# Patient Record
Sex: Female | Born: 1975 | Marital: Single | Smoking: Never smoker
Health system: Southern US, Community
[De-identification: ages and names within clinical notes are randomized; demographics above are authoritative.]

## PROBLEM LIST (undated history)

## (undated) DIAGNOSIS — T7840XA Allergy, unspecified, initial encounter: Secondary | ICD-10-CM

## (undated) DIAGNOSIS — C801 Malignant (primary) neoplasm, unspecified: Secondary | ICD-10-CM

## (undated) DIAGNOSIS — N926 Irregular menstruation, unspecified: Secondary | ICD-10-CM

## (undated) HISTORY — DX: Irregular menstruation, unspecified: N92.6

## (undated) HISTORY — PX: CERVICAL BIOPSY  W/ LOOP ELECTRODE EXCISION: SUR135

## (undated) HISTORY — DX: Malignant (primary) neoplasm, unspecified: C80.1

## (undated) HISTORY — DX: Allergy, unspecified, initial encounter: T78.40XA

---

## 2006-09-30 HISTORY — PX: LEEP: SHX91

## 2008-09-30 HISTORY — PX: LEEP: SHX91

## 2015-02-16 DIAGNOSIS — H524 Presbyopia: Secondary | ICD-10-CM | POA: Insufficient documentation

## 2020-05-12 ENCOUNTER — Encounter: Payer: Self-pay | Admitting: Certified Nurse Midwife

## 2020-05-12 ENCOUNTER — Ambulatory Visit (INDEPENDENT_AMBULATORY_CARE_PROVIDER_SITE_OTHER): Payer: Managed Care, Other (non HMO) | Admitting: Certified Nurse Midwife

## 2020-05-12 ENCOUNTER — Other Ambulatory Visit: Payer: Self-pay

## 2020-05-12 VITALS — BP 113/75 | HR 91 | Ht 66.0 in | Wt 142.0 lb

## 2020-05-12 DIAGNOSIS — Z3009 Encounter for other general counseling and advice on contraception: Secondary | ICD-10-CM

## 2020-05-12 DIAGNOSIS — Z01419 Encounter for gynecological examination (general) (routine) without abnormal findings: Secondary | ICD-10-CM | POA: Diagnosis not present

## 2020-05-12 DIAGNOSIS — Z9889 Other specified postprocedural states: Secondary | ICD-10-CM | POA: Diagnosis not present

## 2020-05-12 DIAGNOSIS — Z1231 Encounter for screening mammogram for malignant neoplasm of breast: Secondary | ICD-10-CM

## 2020-05-12 DIAGNOSIS — Z98891 History of uterine scar from previous surgery: Secondary | ICD-10-CM | POA: Diagnosis not present

## 2020-05-12 DIAGNOSIS — N898 Other specified noninflammatory disorders of vagina: Secondary | ICD-10-CM

## 2020-05-12 DIAGNOSIS — Z124 Encounter for screening for malignant neoplasm of cervix: Secondary | ICD-10-CM | POA: Diagnosis not present

## 2020-05-12 NOTE — Patient Instructions (Signed)
Preventive Care 40-46 Years Old, Female °Preventive care refers to visits with your health care provider and lifestyle choices that can promote health and wellness. This includes: °· A yearly physical exam. This may also be called an annual well check. °· Regular dental visits and eye exams. °· Immunizations. °· Screening for certain conditions. °· Healthy lifestyle choices, such as eating a healthy diet, getting regular exercise, not using drugs or products that contain nicotine and tobacco, and limiting alcohol use. °What can I expect for my preventive care visit? °Physical exam °Your health care provider will check your: °· Height and weight. This may be used to calculate body mass index (BMI), which tells if you are at a healthy weight. °· Heart rate and blood pressure. °· Skin for abnormal spots. °Counseling °Your health care provider may ask you questions about your: °· Alcohol, tobacco, and drug use. °· Emotional well-being. °· Home and relationship well-being. °· Sexual activity. °· Eating habits. °· Work and work environment. °· Method of birth control. °· Menstrual cycle. °· Pregnancy history. °What immunizations do I need? ° °Influenza (flu) vaccine °· This is recommended every year. °Tetanus, diphtheria, and pertussis (Tdap) vaccine °· You may need a Td booster every 10 years. °Varicella (chickenpox) vaccine °· You may need this if you have not been vaccinated. °Zoster (shingles) vaccine °· You may need this after age 60. °Measles, mumps, and rubella (MMR) vaccine °· You may need at least one dose of MMR if you were born in 1957 or later. You may also need a second dose. °Pneumococcal conjugate (PCV13) vaccine °· You may need this if you have certain conditions and were not previously vaccinated. °Pneumococcal polysaccharide (PPSV23) vaccine °· You may need one or two doses if you smoke cigarettes or if you have certain conditions. °Meningococcal conjugate (MenACWY) vaccine °· You may need this if you  have certain conditions. °Hepatitis A vaccine °· You may need this if you have certain conditions or if you travel or work in places where you may be exposed to hepatitis A. °Hepatitis B vaccine °· You may need this if you have certain conditions or if you travel or work in places where you may be exposed to hepatitis B. °Haemophilus influenzae type b (Hib) vaccine °· You may need this if you have certain conditions. °Human papillomavirus (HPV) vaccine °· If recommended by your health care provider, you may need three doses over 6 months. °You may receive vaccines as individual doses or as more than one vaccine together in one shot (combination vaccines). Talk with your health care provider about the risks and benefits of combination vaccines. °What tests do I need? °Blood tests °· Lipid and cholesterol levels. These may be checked every 5 years, or more frequently if you are over 50 years old. °· Hepatitis C test. °· Hepatitis B test. °Screening °· Lung cancer screening. You may have this screening every year starting at age 55 if you have a 30-pack-year history of smoking and currently smoke or have quit within the past 15 years. °· Colorectal cancer screening. All adults should have this screening starting at age 50 and continuing until age 75. Your health care provider may recommend screening at age 45 if you are at increased risk. You will have tests every 1-10 years, depending on your results and the type of screening test. °· Diabetes screening. This is done by checking your blood sugar (glucose) after you have not eaten for a while (fasting). You may have this   done every 1-3 years.  Mammogram. This may be done every 1-2 years. Talk with your health care provider about when you should start having regular mammograms. This may depend on whether you have a family history of breast cancer.  BRCA-related cancer screening. This may be done if you have a family history of breast, ovarian, tubal, or peritoneal  cancers.  Pelvic exam and Pap test. This may be done every 3 years starting at age 30. Starting at age 65, this may be done every 5 years if you have a Pap test in combination with an HPV test. Other tests  Sexually transmitted disease (STD) testing.  Bone density scan. This is done to screen for osteoporosis. You may have this scan if you are at high risk for osteoporosis. Follow these instructions at home: Eating and drinking  Eat a diet that includes fresh fruits and vegetables, whole grains, lean protein, and low-fat dairy.  Take vitamin and mineral supplements as recommended by your health care provider.  Do not drink alcohol if: ? Your health care provider tells you not to drink. ? You are pregnant, may be pregnant, or are planning to become pregnant.  If you drink alcohol: ? Limit how much you have to 0-1 drink a day. ? Be aware of how much alcohol is in your drink. In the [place] one drink equals one 12 oz bottle of beer (355 mL), one 5 oz glass of wine (148 mL), or one 1 oz glass of hard liquor (44 mL). Lifestyle  Take daily care of your teeth and gums.  Stay active. Exercise for at least 30 minutes on 5 or more days each week.  Do not use any products that contain nicotine or tobacco, such as cigarettes, e-cigarettes, and chewing tobacco. If you need help quitting, ask your health care provider.  If you are sexually active, practice safe sex. Use a condom or other form of birth control (contraception) in order to prevent pregnancy and STIs (sexually transmitted infections).  If told by your health care provider, take low-dose aspirin daily starting at age 101. What's next?  Visit your health care provider once a year for a well check visit.  Ask your health care provider how often you should have your eyes and teeth checked.  Stay up to date on all vaccines. This information is not intended to replace advice given to you by your health care provider. Make sure you  discuss any questions you have with your health care provider. Document Revised: 05/28/2018 Document Reviewed: 05/28/2018 Elsevier Patient Education  2020 [place] is this medicine? ETONOGESTREL (et oh noe JES trel) is a contraceptive (birth control) device. It is used to prevent pregnancy. It can be used for up to 3 years. This medicine may be used for other purposes; ask your health care provider or pharmacist if you have questions. COMMON BRAND NAME(S): Implanon, Nexplanon What should I tell my health care provider before I take this medicine? They need to know if you have any of these conditions:  abnormal vaginal bleeding  blood vessel disease or blood clots  breast, cervical, endometrial, ovarian, liver, or uterine cancer  diabetes  gallbladder disease  heart disease or recent heart attack  high blood pressure  high cholesterol or triglycerides  kidney disease  liver disease  migraine headaches  seizures  stroke  tobacco smoker  an unusual or allergic reaction to etonogestrel, anesthetics or antiseptics, other medicines, foods, dyes, or preservatives  pregnant or trying to get pregnant  breast-feeding How should I use this medicine? This device is inserted just under the skin on the inner side of your upper arm by a health care professional. Talk to your pediatrician regarding the use of this medicine in children. Special care may be needed. Overdosage: If you think you have taken too much of this medicine contact a poison control center or emergency room at once. NOTE: This medicine is only for you. Do not share this medicine with others. What if I miss a dose? This does not apply. What may interact with this medicine? Do not take this medicine with any of the following medications:  amprenavir  fosamprenavir This medicine may also interact with the following  medications:  acitretin  aprepitant  armodafinil  bexarotene  bosentan  carbamazepine  certain medicines for fungal infections like fluconazole, ketoconazole, itraconazole and voriconazole  certain medicines to treat hepatitis, HIV or AIDS  cyclosporine  felbamate  griseofulvin  lamotrigine  modafinil  oxcarbazepine  phenobarbital  phenytoin  primidone  rifabutin  rifampin  rifapentine  St. John's wort  topiramate This list may not describe all possible interactions. Give your health care provider a list of all the medicines, herbs, non-prescription drugs, or dietary supplements you use. Also tell them if you smoke, drink alcohol, or use illegal drugs. Some items may interact with your medicine. What should I watch for while using this medicine? This product does not protect you against HIV infection (AIDS) or other sexually transmitted diseases. You should be able to feel the implant by pressing your fingertips over the skin where it was inserted. Contact your doctor if you cannot feel the implant, and use a non-hormonal birth control method (such as condoms) until your doctor confirms that the implant is in place. Contact your doctor if you think that the implant may have broken or become bent while in your arm. You will receive a user card from your health care provider after the implant is inserted. The card is a record of the location of the implant in your upper arm and when it should be removed. Keep this card with your health records. What side effects may I notice from receiving this medicine? Side effects that you should report to your doctor or health care professional as soon as possible:  allergic reactions like skin rash, itching or hives, swelling of the face, lips, or tongue  breast lumps, breast tissue changes, or discharge  breathing problems  changes in emotions or moods  coughing up blood  if you feel that the implant may have broken  or bent while in your arm  high blood pressure  pain, irritation, swelling, or bruising at the insertion site  scar at site of insertion  signs of infection at the insertion site such as fever, and skin redness, pain or discharge  signs and symptoms of a blood clot such as breathing problems; changes in vision; chest pain; severe, sudden headache; pain, swelling, warmth in the leg; trouble speaking; sudden numbness or weakness of the face, arm or leg  signs and symptoms of liver injury like dark yellow or brown urine; general ill feeling or flu-like symptoms; light-colored stools; loss of appetite; nausea; right upper belly pain; unusually weak or tired; yellowing of the eyes or skin  unusual vaginal bleeding, discharge Side effects that usually do not require medical attention (report to your doctor or health care professional if they continue or are bothersome):  acne  breast  pain or tenderness  headache  irregular menstrual bleeding  nausea This list may not describe all possible side effects. Call your doctor for medical advice about side effects. You may report side effects to FDA at 1-800-FDA-1088. Where should I keep my medicine? This drug is given in a hospital or clinic and will not be stored at home. NOTE: This sheet is a summary. It may not cover all possible information. If you have questions about this medicine, talk to your doctor, pharmacist, or health care provider.  2020 Elsevier/Gold Standard (2019-06-29 11:33:04)

## 2020-05-12 NOTE — Progress Notes (Signed)
ANNUAL PREVENTATIVE CARE GYN  ENCOUNTER NOTE  Subjective:       Monica Gallegos is a 44 y.o. G42P1001 female here for a routine annual gynecologic exam.  Current complaints: 1. Needs Pap smear and mammogram order; noticed vaginal discharge prior to menses 2. Desires contraception  Patient recently relocated from [place], works for [employer].   Denies difficulty breathing or respiratory distress, chest pain, abdominal pain, excessive vaginal bleeding, dysuria, and leg pain or swelling.    Gynecologic History  Patient's last menstrual period was 05/11/2020 (exact date). Period Cycle (Days):  (random) Period Duration (Days): 3 Period Pattern: (!) Irregular Menstrual Flow: Light Menstrual Control: Thin pad Menstrual Control Change Freq (Hours): 2/3 Dysmenorrhea: (!) Mild Dysmenorrhea Symptoms: Cramping, Diarrhea  Contraception: coitus interruptus   Upstream - 05/12/20 1523      Pregnancy Intention Screening   Does the patient want to become pregnant in the next year? No    Does the patient's partner want to become pregnant in the next year? No    Would the patient like to discuss contraceptive options today? Yes          The pregnancy intention screening data noted above was reviewed. Potential methods of contraception were discussed. The patient elected to proceed with Hormonal Implant.   Last Pap: 09/2018. 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 per patient  Last mammogram: 2020. 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 per patient  Obstetric History  OB History  Gravida Para Term Preterm AB Living  1 1 1     1   SAB TAB Ectopic Multiple Live Births          1    # Outcome Date GA Lbr Len/2nd Weight Sex Delivery Anes PTL Lv  1 Term 10/15/13   9 lb 6 oz (4.252 kg) M CS-Unspec EPI N LIV    History reviewed. No pertinent past medical history.  Past Surgical History:  Procedure Laterality Date  . CERVICAL BIOPSY  W/ LOOP ELECTRODE EXCISION    . CESAREAN SECTION    . LEEP  2008  . LEEP  2010    Allergies   Allergen Reactions  . Acetaminophen Hives  . Penicillins Hives    Social History   Socioeconomic History  . Marital status: Single    Spouse name: Not on file  . Number of children: Not on file  . Years of education: Not on file  . Highest education level: Not on file  Occupational History  . Not on file  Tobacco Use  . Smoking status: Never Smoker  Vaping Use  . Vaping Use: Never used  Substance and Sexual Activity  . Alcohol use: Yes    Alcohol/week: 2.0 standard drinks    Types: 1 Glasses of wine, 1 Cans of beer per week  . Drug use: Not Currently  . Sexual activity: Yes    Birth control/protection: None  Other Topics Concern  . Not on file  Social History Narrative  . Not on file   Social Determinants of Health   Financial Resource Strain:   . Difficulty of Paying Living Expenses:   Food Insecurity:   . Worried About Charity fundraiser in the Last Year:   . Arboriculturist in the Last Year:   Transportation Needs:   . Film/video editor (Medical):   Marland Kitchen Lack of Transportation (Non-Medical):   Physical Activity:   . Days of Exercise per Week:   . Minutes of Exercise per Session:   Stress:   . Feeling  of Stress :   Social Connections:   . Frequency of Communication with Friends and Family:   . Frequency of Social Gatherings with Friends and Family:   . Attends Religious Services:   . Active Member of Clubs or Organizations:   . Attends Archivist Meetings:   Marland Kitchen Marital Status:   Intimate Partner Violence:   . Fear of Current or Ex-Partner:   . Emotionally Abused:   Marland Kitchen Physically Abused:   . Sexually Abused:     Family History  Problem Relation Age of Onset  . Breast cancer Mother   . Hypertension Mother   . Diabetes Father   . Hypertension Father   . Breast cancer Sister   . Breast cancer Maternal Aunt     The following portions of the patient's history were reviewed and updated as appropriate: allergies, current medications, past  family history, past medical history, past social history, past surgical history and problem list.  Review of Systems  ROS negative except as noted above. Information obtained from patient.    Objective:   BP 113/75   Pulse 91   Ht 5\' 6"  (1.676 m)   Wt 142 lb (64.4 kg)   LMP 05/11/2020 (Exact Date)   BMI 22.92 kg/m    CONSTITUTIONAL: Well-developed, well-nourished female in no acute distress.   PSYCHIATRIC: Normal mood and affect. Normal behavior. Normal judgment and thought content.  Reserve: Alert and oriented to person, place, and time. Normal muscle tone coordination. No cranial nerve deficit noted.  HENT:  Normocephalic, atraumatic, External right and left ear normal. Oropharynx is clear and moist  EYES: Conjunctivae and EOM are normal. Pupils are equal, round, and reactive to light. No scleral icterus.   NECK: Normal range of motion, supple, no masses.  Normal thyroid.   SKIN: Skin is warm and dry. No rash noted. Not diaphoretic. No  erythema. No pallor.  CARDIOVASCULAR: Normal heart rate noted, regular rhythm, no murmur.  RESPIRATORY: Clear to auscultation bilaterally. Effort and breath sounds normal, no problems with respiration noted.  BREASTS: Symmetric in size. No masses, skin changes, nipple drainage, or lymphadenopathy.  ABDOMEN: Soft, normal bowel sounds, no distention noted.  No tenderness, rebound or guarding.   PELVIC:  External Genitalia: Normal  Vagina: Normal, Vaginal swab collected  Cervix: Normal, Pap collected  Uterus: Normal  Adnexa: Normal  MUSCULOSKELETAL: Normal range of motion. No tenderness.  No cyanosis, clubbing, or edema.  2+ distal pulses.  LYMPHATIC: No Axillary, Supraclavicular, or Inguinal Adenopathy.  Assessment:   Annual gynecologic examination 44 y.o.   Contraception: coitus interruptus, plans Nexplanon placement  Normal BMI   Problem List Items Addressed This Visit      Other   History of loop electrical excision  procedure (LEEP)   Relevant Orders   IGP, Aptima HPV, rfx 16/18,45   History of cesarean section    Other Visit Diagnoses    Well woman exam    -  Primary   Relevant Orders   IGP, Aptima HPV, rfx 16/18,45   MM DIGITAL SCREENING BILATERAL   NuSwab Vaginitis (VG)   Screening for cervical cancer       Relevant Orders   IGP, Aptima HPV, rfx 16/18,45   Screening mammogram, encounter for       Relevant Orders   MM DIGITAL SCREENING BILATERAL   Encounter for counseling regarding contraception       Vaginal discharge       Relevant Orders   NuSwab Vaginitis (VG)  Plan:   Pap: Pap Co Test   Mammogram: Ordered  Labs: Declined  Routine preventative health maintenance measures emphasized: Exercise/Diet/Weight control, Tobacco Warnings, Alcohol/Substance use risks and Stress Management; see AVS  Reviewed all forms of birth control options available including abstinence; fertility period awareness methods; over the counter/barrier methods; hormonal contraceptive medication including pill, patch, ring, injection,contraceptive implant; hormonal and nonhormonal IUDs; permanent sterilization options including vasectomy and the various tubal sterilization modalities. Risks and benefits reviewed.  Questions were answered.  Information was given to patient to review.   Return to [place] for [employer] or sooner if needed   Dani Gobble, CNM  [facility], [facility] 05/12/20 4:02 PM

## 2020-05-15 ENCOUNTER — Telehealth: Payer: Self-pay

## 2020-05-15 LAB — NUSWAB VAGINITIS (VG)
Atopobium vaginae: HIGH Score — AB
BVAB 2: HIGH Score — AB
Candida albicans, NAA: NEGATIVE
Candida glabrata, NAA: NEGATIVE
Megasphaera 1: HIGH Score — AB
Trich vag by NAA: NEGATIVE

## 2020-05-15 NOTE — Telephone Encounter (Signed)
Called patient at 11:01 am. No answer. Left a voice mail to call back.

## 2020-05-16 ENCOUNTER — Telehealth: Payer: Self-pay

## 2020-05-16 ENCOUNTER — Other Ambulatory Visit: Payer: Self-pay

## 2020-05-16 MED ORDER — METRONIDAZOLE 500 MG PO TABS
500.0000 mg | ORAL_TABLET | Freq: Two times a day (BID) | ORAL | 0 refills | Status: DC
Start: 2020-05-16 — End: 2020-07-06

## 2020-05-16 NOTE — Telephone Encounter (Signed)
Called and spoke to patient. Pt aware of results. I reviewed all treatment options per JML. Patient aware of BV treatment options. Pt requests Flagyl sent to [facility] [address]. Pt encouraged to install and use Mychart.

## 2020-05-16 NOTE — Telephone Encounter (Signed)
-----   Message from Diona Fanti, CNM sent at 05/15/2020 10:29 AM EDT ----- Please contact patient with results of vaginal swab, positive for bacterial vaginosis which is an overgrowth of the normal bacteria in the vagina.   Treatment options for bacterial vaginosis include Flagyl, an oral pill taken twice a day for a week; Metrogel, a vaginal gel inserted nightly for five (5) days; or Solosec, a single dose sprinkle packet taken once (not covered by all insurances, but a co-pay card is available).   May continue to use the boric acid suppositories samples if desired.   Encourage to activate MyChart.    Thanks, Dani Gobble, CNM

## 2020-05-17 LAB — IGP, APTIMA HPV, RFX 16/18,45: HPV Aptima: NEGATIVE

## 2020-05-18 ENCOUNTER — Telehealth: Payer: Self-pay

## 2020-05-18 NOTE — Telephone Encounter (Signed)
Called patient per Dani Gobble, CNM. Patient voicemail full. Unable to leave voicemail.

## 2020-05-23 ENCOUNTER — Telehealth: Payer: Self-pay

## 2020-05-23 NOTE — Telephone Encounter (Signed)
Called patient to inform of results. Voicemail full- Unable to leave message.

## 2020-06-14 ENCOUNTER — Other Ambulatory Visit: Payer: Self-pay

## 2020-06-14 ENCOUNTER — Ambulatory Visit
Admission: RE | Admit: 2020-06-14 | Discharge: 2020-06-14 | Disposition: A | Discharge status: Home / Self Care | Payer: Managed Care, Other (non HMO) | Source: Ambulatory Visit | Attending: Certified Nurse Midwife | Admitting: Certified Nurse Midwife

## 2020-06-14 DIAGNOSIS — Z01419 Encounter for gynecological examination (general) (routine) without abnormal findings: Secondary | ICD-10-CM

## 2020-06-14 DIAGNOSIS — Z1231 Encounter for screening mammogram for malignant neoplasm of breast: Secondary | ICD-10-CM | POA: Insufficient documentation

## 2020-07-03 ENCOUNTER — Encounter: Payer: Self-pay | Admitting: Certified Nurse Midwife

## 2020-07-03 ENCOUNTER — Ambulatory Visit (INDEPENDENT_AMBULATORY_CARE_PROVIDER_SITE_OTHER): Payer: Managed Care, Other (non HMO) | Admitting: Certified Nurse Midwife

## 2020-07-03 ENCOUNTER — Other Ambulatory Visit: Payer: Self-pay

## 2020-07-03 VITALS — BP 117/85 | HR 73 | Ht 66.0 in | Wt 142.3 lb

## 2020-07-03 DIAGNOSIS — Z30017 Encounter for initial prescription of implantable subdermal contraceptive: Secondary | ICD-10-CM | POA: Diagnosis not present

## 2020-07-03 DIAGNOSIS — Z975 Presence of (intrauterine) contraceptive device: Secondary | ICD-10-CM

## 2020-07-03 LAB — POCT URINE PREGNANCY: Preg Test, Ur: NEGATIVE

## 2020-07-03 NOTE — Patient Instructions (Signed)
Etonogestrel implant What is this medicine? ETONOGESTREL (et oh noe JES trel) is a contraceptive (birth control) device. It is used to prevent pregnancy. It can be used for up to 3 years. This medicine may be used for other purposes; ask your health care provider or pharmacist if you have questions. COMMON BRAND NAME(S): Implanon, Nexplanon What should I tell my health care provider before I take this medicine? They need to know if you have any of these conditions:  abnormal vaginal bleeding  blood vessel disease or blood clots  breast, cervical, endometrial, ovarian, liver, or uterine cancer  diabetes  gallbladder disease  heart disease or recent heart attack  high blood pressure  high cholesterol or triglycerides  kidney disease  liver disease  migraine headaches  seizures  stroke  tobacco smoker  an unusual or allergic reaction to etonogestrel, anesthetics or antiseptics, other medicines, foods, dyes, or preservatives  pregnant or trying to get pregnant  breast-feeding How should I use this medicine? This device is inserted just under the skin on the inner side of your upper arm by a health care professional. Talk to your pediatrician regarding the use of this medicine in children. Special care may be needed. Overdosage: If you think you have taken too much of this medicine contact a poison control center or emergency room at once. NOTE: This medicine is only for you. Do not share this medicine with others. What if I miss a dose? This does not apply. What may interact with this medicine? Do not take this medicine with any of the following medications:  amprenavir  fosamprenavir This medicine may also interact with the following medications:  acitretin  aprepitant  armodafinil  bexarotene  bosentan  carbamazepine  certain medicines for fungal infections like fluconazole, ketoconazole, itraconazole and voriconazole  certain medicines to treat  hepatitis, HIV or AIDS  cyclosporine  felbamate  griseofulvin  lamotrigine  modafinil  oxcarbazepine  phenobarbital  phenytoin  primidone  rifabutin  rifampin  rifapentine  St. John's wort  topiramate This list may not describe all possible interactions. Give your health care provider a list of all the medicines, herbs, non-prescription drugs, or dietary supplements you use. Also tell them if you smoke, drink alcohol, or use illegal drugs. Some items may interact with your medicine. What should I watch for while using this medicine? This product does not protect you against HIV infection (AIDS) or other sexually transmitted diseases. You should be able to feel the implant by pressing your fingertips over the skin where it was inserted. Contact your doctor if you cannot feel the implant, and use a non-hormonal birth control method (such as condoms) until your doctor confirms that the implant is in place. Contact your doctor if you think that the implant may have broken or become bent while in your arm. You will receive a user card from your health care provider after the implant is inserted. The card is a record of the location of the implant in your upper arm and when it should be removed. Keep this card with your health records. What side effects may I notice from receiving this medicine? Side effects that you should report to your doctor or health care professional as soon as possible:  allergic reactions like skin rash, itching or hives, swelling of the face, lips, or tongue  breast lumps, breast tissue changes, or discharge  breathing problems  changes in emotions or moods  coughing up blood  if you feel that the implant   may have broken or bent while in your arm  high blood pressure  pain, irritation, swelling, or bruising at the insertion site  scar at site of insertion  signs of infection at the insertion site such as fever, and skin redness, pain or  discharge  signs and symptoms of a blood clot such as breathing problems; changes in vision; chest pain; severe, sudden headache; pain, swelling, warmth in the leg; trouble speaking; sudden numbness or weakness of the face, arm or leg  signs and symptoms of liver injury like dark yellow or brown urine; general ill feeling or flu-like symptoms; light-colored stools; loss of appetite; nausea; right upper belly pain; unusually weak or tired; yellowing of the eyes or skin  unusual vaginal bleeding, discharge Side effects that usually do not require medical attention (report to your doctor or health care professional if they continue or are bothersome):  acne  breast pain or tenderness  headache  irregular menstrual bleeding  nausea This list may not describe all possible side effects. Call your doctor for medical advice about side effects. You may report side effects to FDA at 1-800-FDA-1088. Where should I keep my medicine? This drug is given in a hospital or clinic and will not be stored at home. NOTE: This sheet is a summary. It may not cover all possible information. If you have questions about this medicine, talk to your doctor, pharmacist, or health care provider.  2020 Elsevier/Gold Standard (2019-06-29 11:33:04)    Nexplanon Instructions After Insertion   Keep bandage clean and dry for 24 hours   May use ice/Tylenol/Ibuprofen for soreness or pain   If you develop fever, drainage or increased warmth from incision site-contact office immediately

## 2020-07-06 ENCOUNTER — Encounter: Payer: Self-pay | Admitting: Certified Nurse Midwife

## 2020-07-06 NOTE — Progress Notes (Signed)
Monica Gallegos is a 44 y.o. year old Hispanic female here for Nexplanon insertion.   Risks/benefits/side effects of Nexplanon have been discussed and her questions have been answered.  Specifically, a failure rate of 09/998 has been reported, with an increased failure rate if pt takes Blakeslee and/or antiseizure medicaitons.  Monica Gallegos is aware of the common side effect of irregular bleeding, which the incidence of decreases over time.  BP 117/85    Pulse 73    Ht 5\' 6"  (1.676 m)    Wt 142 lb 4.8 oz (64.5 kg)    LMP 06/18/2020    BMI 22.97 kg/m   Recent Results (from the past 2160 hour(s))  IGP, Aptima HPV, rfx 16/18,45     Status: None   Collection Time: 05/12/20  4:34 PM  Result Value Ref Range   Interpretation NILM,QC     Comment: NEGATIVE FOR INTRAEPITHELIAL LESION OR MALIGNANCY. THIS SPECIMEN WAS RESCREENED AS PART OF OUR QUALITY CONTROL PROGRAM.    Category NIL     Comment: Negative for Intraepithelial Lesion   Adequacy SECNI,AOCX     Comment: Satisfactory for evaluation. No endocervical component is identified. The absence of an endocervical component was confirmed by an additional screening evaluation.    Clinician Provided ICD10 Comment     Comment: Z01.419 Z12.4 Z98.890    Performed by: Comment     Comment: Malinnie Reather Laurence, Cytotechnologist (ASCP)   QC reviewed by: Comment     Comment: Larey Brick, Supervisory Cytotechnologist (ASCP)   Note: Comment     Comment: The Pap smear is a screening test designed to aid in the detection of premalignant and malignant conditions of the uterine cervix.  It is not a diagnostic procedure and should not be used as the sole means of detecting cervical cancer.  Both false-positive and false-negative reports do occur.    Test Methodology Comment     Comment: This liquid based ThinPrep(R) pap test was screened with the use of an image guided system.    HPV Aptima Negative Negative    Comment: This nucleic acid amplification  test detects fourteen high-risk HPV types (16,18,31,33,35,39,45,51,52,56,58,59,66,68) without differentiation.   NuSwab Vaginitis (VG)     Status: Abnormal   Collection Time: 05/12/20  4:38 PM  Result Value Ref Range   Atopobium vaginae High - 2 (A) Score   BVAB 2 High - 2 (A) Score   Megasphaera 1 High - 2 (A) Score    Comment: Calculate total score by adding the 3 individual bacterial vaginosis (BV) marker scores together.  Total score is interpreted as follows: Total score 0-1: Indicates the absence of BV. Total score   2: Indeterminate for BV. Additional clinical                  data should be evaluated to establish a                  diagnosis. Total score 3-6: Indicates the presence of BV. This test was developed and its performance characteristics determined by [facility].  It has not been cleared or approved by the Food and Drug Administration.    Candida albicans, NAA Negative Negative   Candida glabrata, NAA Negative Negative   Trich vag by NAA Negative Negative  POCT urine pregnancy     Status: None   Collection Time: 07/03/20  4:27 PM  Result Value Ref Range   Preg Test, Ur Negative Negative    She is right-handed, so  her left arm, approximately 4 inches proximal from the elbow, was cleansed with alcohol and anesthetized with 2cc of 2% Lidocaine.  The area was cleansed again with betadine and the Nexplanon was inserted per manufacturer's recommendations without difficulty.  A steri-strip and pressure bandage were applied.  Pt was instructed to keep the area clean and dry, remove pressure bandage in 24 hours, and keep insertion site covered with the steri-strip for 3-5 days.  Back up contraception was recommended for 2 weeks.  She was given a card indicating date Nexplanon was inserted and date it needs to be removed.  Reviewed red flag symptoms and when to call.   RTC as previously scheduled or sooner if needed.    Dani Gobble, CNM [facility],  [facility] 07/06/20 10:19 AM   NDC: 5189-8421-03 Lot: [id number] Exp: 09/2022

## 2021-01-11 ENCOUNTER — Ambulatory Visit: Payer: Managed Care, Other (non HMO) | Admitting: Certified Nurse Midwife

## 2021-01-11 ENCOUNTER — Other Ambulatory Visit: Payer: Self-pay

## 2021-01-11 ENCOUNTER — Encounter: Payer: Self-pay | Admitting: Certified Nurse Midwife

## 2021-01-11 VITALS — BP 131/84 | HR 81 | Resp 16 | Ht 66.0 in | Wt 147.1 lb

## 2021-01-11 DIAGNOSIS — Z803 Family history of malignant neoplasm of breast: Secondary | ICD-10-CM | POA: Insufficient documentation

## 2021-01-11 DIAGNOSIS — Z3046 Encounter for surveillance of implantable subdermal contraceptive: Secondary | ICD-10-CM

## 2021-01-11 DIAGNOSIS — Z1239 Encounter for other screening for malignant neoplasm of breast: Secondary | ICD-10-CM

## 2021-01-11 NOTE — Patient Instructions (Signed)
Nexplanon Instructions After Insertion/Removal   Keep bandage clean and dry for 24 hours   May use ice/Tylenol/Ibuprofen for soreness or pain   If you develop fever, drainage or increased warmth from incision site-contact office immediately   Preventive Care 48-47 Years Old, Female Preventive care refers to lifestyle choices and visits with your health care provider that can promote health and wellness. This includes:  A yearly physical exam. This is also called an annual wellness visit.  Regular dental and eye exams.  Immunizations.  Screening for certain conditions.  Healthy lifestyle choices, such as: ? Eating a healthy diet. ? Getting regular exercise. ? Not using drugs or products that contain nicotine and tobacco. ? Limiting alcohol use. What can I expect for my preventive care visit? Physical exam Your health care provider will check your:  Height and weight. These may be used to calculate your BMI (body mass index). BMI is a measurement that tells if you are at a healthy weight.  Heart rate and blood pressure.  Body temperature.  Skin for abnormal spots. Counseling Your health care provider may ask you questions about your:  Past medical problems.  Family's medical history.  Alcohol, tobacco, and drug use.  Emotional well-being.  Home life and relationship well-being.  Sexual activity.  Diet, exercise, and sleep habits.  Work and work Statistician.  Access to firearms.  Method of birth control.  Menstrual cycle.  Pregnancy history. What immunizations do I need? Vaccines are usually given at various ages, according to a schedule. Your health care provider will recommend vaccines for you based on your age, medical history, and lifestyle or other factors, such as travel or where you work.   What tests do I need? Blood tests  Lipid and cholesterol levels. These may be checked every 5 years, or more often if you are over 50 years  old.  Hepatitis C test.  Hepatitis B test. Screening  Lung cancer screening. You may have this screening every year starting at age 2 if you have a 30-pack-year history of smoking and currently smoke or have quit within the past 15 years.  Colorectal cancer screening. ? All adults should have this screening starting at age 47 and continuing until age 64. ? Your health care provider may recommend screening at age 83 if you are at increased risk. ? You will have tests every 1-10 years, depending on your results and the type of screening test.  Diabetes screening. ? This is done by checking your blood sugar (glucose) after you have not eaten for a while (fasting). ? You may have this done every 1-3 years.  Mammogram. ? This may be done every 1-2 years. ? Talk with your health care provider about when you should start having regular mammograms. This may depend on whether you have a family history of breast cancer.  BRCA-related cancer screening. This may be done if you have a family history of breast, ovarian, tubal, or peritoneal cancers.  Pelvic exam and Pap test. ? This may be done every 3 years starting at age 71. ? Starting at age 61, this may be done every 5 years if you have a Pap test in combination with an HPV test. Other tests  STD (sexually transmitted disease) testing, if you are at risk.  Bone density scan. This is done to screen for osteoporosis. You may have this scan if you are at high risk for osteoporosis. Talk with your health care provider about your test results, treatment options,  and if necessary, the need for more tests. Follow these instructions at home: Eating and drinking  Eat a diet that includes fresh fruits and vegetables, whole grains, lean protein, and low-fat dairy products.  Take vitamin and mineral supplements as recommended by your health care provider.  Do not drink alcohol if: ? Your health care provider tells you not to drink. ? You are  pregnant, may be pregnant, or are planning to become pregnant.  If you drink alcohol: ? Limit how much you have to 0-1 drink a day. ? Be aware of how much alcohol is in your drink. In the [place] one drink equals one 12 oz bottle of beer (355 mL), one 5 oz glass of wine (148 mL), or one 1 oz glass of hard liquor (44 mL).   Lifestyle  Take daily care of your teeth and gums. Brush your teeth every morning and night with fluoride toothpaste. Floss one time each day.  Stay active. Exercise for at least 30 minutes 5 or more days each week.  Do not use any products that contain nicotine or tobacco, such as cigarettes, e-cigarettes, and chewing tobacco. If you need help quitting, ask your health care provider.  Do not use drugs.  If you are sexually active, practice safe sex. Use a condom or other form of protection to prevent STIs (sexually transmitted infections).  If you do not wish to become pregnant, use a form of birth control. If you plan to become pregnant, see your health care provider for a prepregnancy visit.  If told by your health care provider, take low-dose aspirin daily starting at age 58.  Find healthy ways to cope with stress, such as: ? Meditation, yoga, or listening to music. ? Journaling. ? Talking to a trusted person. ? Spending time with friends and family. Safety  Always wear your seat belt while driving or riding in a vehicle.  Do not drive: ? If you have been drinking alcohol. Do not ride with someone who has been drinking. ? When you are tired or distracted. ? While texting.  Wear a helmet and other protective equipment during sports activities.  If you have firearms in your house, make sure you follow all gun safety procedures. What's next?  Visit your health care provider once a year for an annual wellness visit.  Ask your health care provider how often you should have your eyes and teeth checked.  Stay up to date on all vaccines. This information is  not intended to replace advice given to you by your health care provider. Make sure you discuss any questions you have with your health care provider. Document Revised: 06/20/2020 Document Reviewed: 05/28/2018 Elsevier Patient Education  2021 [employer].

## 2021-01-11 NOTE — Progress Notes (Signed)
Monica Gallegos is a 45 y.o. year old female here for Nexplanon removal due to concern regarding increased cancer risk due to family history and weight gain.    Written consent obtained for removal, see chart.   BP 131/84   Pulse 81   Resp 16   Ht 5\' 6"  (1.676 m)   Wt 147 lb 1.6 oz (66.7 kg)   BMI 23.74 kg/m   Appropriate time out taken. Implanon site identified.  Area prepped in usual sterile fashon. One cc of 2% lidocaine was used to anesthetize the area at the distal end of the implant. A small stab incision was made right beside the implant on the distal portion.  The Nexplanon rod was grasped using hemostats and removed without difficulty.  There was less than 3 cc blood loss. There were no complications.  Steri-strips were applied over the small incision and a pressure bandage was applied.  The patient tolerated the procedure well.  She was instructed to keep the area clean and dry, remove pressure bandage in 24 hours, and keep insertion site covered with the steri-strip for 3-5 days.    Reviewed red flag symptoms and when to call.   Breast ultrasound ordered to be completed with mammogram, see chart.   RTC as previously scheduled or sooner if needed.    Dani Gobble, CNM [facility], [facility] 01/11/21 3:05 PM

## 2021-02-16 ENCOUNTER — Encounter: Payer: Self-pay | Admitting: Certified Nurse Midwife

## 2021-05-14 ENCOUNTER — Encounter: Payer: Managed Care, Other (non HMO) | Admitting: Certified Nurse Midwife

## 2021-05-22 ENCOUNTER — Encounter: Payer: Self-pay | Admitting: Certified Nurse Midwife

## 2021-06-12 ENCOUNTER — Other Ambulatory Visit: Payer: Self-pay

## 2021-06-12 ENCOUNTER — Encounter: Payer: Self-pay | Admitting: Certified Nurse Midwife

## 2021-06-12 ENCOUNTER — Ambulatory Visit (INDEPENDENT_AMBULATORY_CARE_PROVIDER_SITE_OTHER): Payer: Managed Care, Other (non HMO) | Admitting: Certified Nurse Midwife

## 2021-06-12 VITALS — BP 133/95 | HR 85 | Ht 66.0 in | Wt 147.9 lb

## 2021-06-12 DIAGNOSIS — Z01419 Encounter for gynecological examination (general) (routine) without abnormal findings: Secondary | ICD-10-CM

## 2021-06-12 DIAGNOSIS — N926 Irregular menstruation, unspecified: Secondary | ICD-10-CM

## 2021-06-12 DIAGNOSIS — Z1239 Encounter for other screening for malignant neoplasm of breast: Secondary | ICD-10-CM | POA: Diagnosis not present

## 2021-06-12 LAB — POCT URINE PREGNANCY: Preg Test, Ur: NEGATIVE

## 2021-06-12 NOTE — Patient Instructions (Signed)
Preventive Care 40-47 Years Old, Female Preventive care refers to lifestyle choices and visits with your health care provider that can promote health and wellness. This includes: A yearly physical exam. This is also called an annual wellness visit. Regular dental and eye exams. Immunizations. Screening for certain conditions. Healthy lifestyle choices, such as: Eating a healthy diet. Getting regular exercise. Not using drugs or products that contain nicotine and tobacco. Limiting alcohol use. What can I expect for my preventive care visit? Physical exam Your health care provider will check your: Height and weight. These may be used to calculate your BMI (body mass index). BMI is a measurement that tells if you are at a healthy weight. Heart rate and blood pressure. Body temperature. Skin for abnormal spots. Counseling Your health care provider may ask you questions about your: Past medical problems. Family's medical history. Alcohol, tobacco, and drug use. Emotional well-being. Home life and relationship well-being. Sexual activity. Diet, exercise, and sleep habits. Work and work environment. Access to firearms. Method of birth control. Menstrual cycle. Pregnancy history. What immunizations do I need? Vaccines are usually given at various ages, according to a schedule. Your health care provider will recommend vaccines for you based on your age, medical history, and lifestyle or other factors, such as travel or where you work. What tests do I need? Blood tests Lipid and cholesterol levels. These may be checked every 5 years, or more often if you are over 50 years old. Hepatitis C test. Hepatitis B test. Screening Lung cancer screening. You may have this screening every year starting at age 55 if you have a 30-pack-year history of smoking and currently smoke or have quit within the past 15 years. Colorectal cancer screening. All adults should have this screening starting at  age 50 and continuing until age 75. Your health care provider may recommend screening at age 45 if you are at increased risk. You will have tests every 1-10 years, depending on your results and the type of screening test. Diabetes screening. This is done by checking your blood sugar (glucose) after you have not eaten for a while (fasting). You may have this done every 1-3 years. Mammogram. This may be done every 1-2 years. Talk with your health care provider about when you should start having regular mammograms. This may depend on whether you have a family history of breast cancer. BRCA-related cancer screening. This may be done if you have a family history of breast, ovarian, tubal, or peritoneal cancers. Pelvic exam and Pap test. This may be done every 3 years starting at age 21. Starting at age 30, this may be done every 5 years if you have a Pap test in combination with an HPV test. Other tests STD (sexually transmitted disease) testing, if you are at risk. Bone density scan. This is done to screen for osteoporosis. You may have this scan if you are at high risk for osteoporosis. Talk with your health care provider about your test results, treatment options, and if necessary, the need for more tests. Follow these instructions at home: Eating and drinking  Eat a diet that includes fresh fruits and vegetables, whole grains, lean protein, and low-fat dairy products. Take vitamin and mineral supplements as recommended by your health care provider. Do not drink alcohol if: Your health care provider tells you not to drink. You are pregnant, may be pregnant, or are planning to become pregnant. If you drink alcohol: Limit how much you have to 0-1 drink a day. Be   aware of how much alcohol is in your drink. In the [place] one drink equals one 12 oz bottle of beer (355 mL), one 5 oz glass of wine (148 mL), or one 1 oz glass of hard liquor (44 mL). Lifestyle Take daily care of your teeth and  gums. Brush your teeth every morning and night with fluoride toothpaste. Floss one time each day. Stay active. Exercise for at least 30 minutes 5 or more days each week. Do not use any products that contain nicotine or tobacco, such as cigarettes, e-cigarettes, and chewing tobacco. If you need help quitting, ask your health care provider. Do not use drugs. If you are sexually active, practice safe sex. Use a condom or other form of protection to prevent STIs (sexually transmitted infections). If you do not wish to become pregnant, use a form of birth control. If you plan to become pregnant, see your health care provider for a prepregnancy visit. If told by your health care provider, take low-dose aspirin daily starting at age 63. Find healthy ways to cope with stress, such as: Meditation, yoga, or listening to music. Journaling. Talking to a trusted person. Spending time with friends and family. Safety Always wear your seat belt while driving or riding in a vehicle. Do not drive: If you have been drinking alcohol. Do not ride with someone who has been drinking. When you are tired or distracted. While texting. Wear a helmet and other protective equipment during sports activities. If you have firearms in your house, make sure you follow all gun safety procedures. What's next? Visit your health care provider once a year for an annual wellness visit. Ask your health care provider how often you should have your eyes and teeth checked. Stay up to date on all vaccines. This information is not intended to replace advice given to you by your health care provider. Make sure you discuss any questions you have with your health care provider. Document Revised: 11/24/2020 Document Reviewed: 05/28/2018 Elsevier Patient Education  2022 [employer].

## 2021-06-12 NOTE — Progress Notes (Signed)
GYNECOLOGY ANNUAL PREVENTATIVE CARE ENCOUNTER NOTE  History:     Monica Gallegos is a 45 y.o. G28P1001 female here for a routine annual gynecologic exam.  Current complaints: none.   Denies abnormal vaginal bleeding, discharge, pelvic pain, problems with intercourse or other gynecologic concerns.     Social Relationship:single ( in relationship boyfriend) Living: with erh 79 yr old son Work: Oncologist Exercise: none Smoke/Alcohol/drug use: alcohol few time week, No smoking or drugs.   Gynecologic History Patient's last menstrual period was 03/19/2021 (approximate). Contraception: none, partner getting vasectomy  Last Pap: 04/2020. 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 with negative HPV Last mammogram: 05/2020. 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   Upstream - 06/12/21 1514       Pregnancy Intention Screening   Does the patient want to become pregnant in the next year? No    Does the patient's partner want to become pregnant in the next year? No    Would the patient like to discuss contraceptive options today? No      Contraception Wrap Up   Current Method No Method - Other Reason    Contraception Counseling Provided No            The pregnancy intention screening data noted above was reviewed. Potential methods of contraception were discussed. The patient elected to proceed with declines.  Obstetric History OB History  Gravida Para Term Preterm AB Living  '1 1 1     1  '$ SAB IAB Ectopic Multiple Live Births          1    # Outcome Date GA Lbr Len/2nd Weight Sex Delivery Anes PTL Lv  1 Term 10/15/13   9 lb 6 oz (4.252 kg) M CS-Unspec EPI N LIV    Past Medical History:  Diagnosis Date   Irregular menses     Past Surgical History:  Procedure Laterality Date   CERVICAL BIOPSY  W/ LOOP ELECTRODE EXCISION     CESAREAN SECTION     LEEP  2008   LEEP  2010    No current outpatient medications on file prior to visit.   No current facility-administered medications on file prior to visit.     Allergies  Allergen Reactions   Acetaminophen Hives   Penicillins Hives    Social History:  reports that she has never smoked. She has never used smokeless tobacco. She reports current alcohol use of about 2.0 standard drinks per week. She reports that she does not currently use drugs.  Family History  Problem Relation Age of Onset   Breast cancer Mother 63   Hypertension Mother    Diabetes Father    Hypertension Father    Breast cancer Sister 71   Breast cancer Maternal Aunt 63   Ovarian cancer Neg Hx    Cancer - Colon Neg Hx     The following portions of the patient's history were reviewed and updated as appropriate: allergies, current medications, past family history, past medical history, past social history, past surgical history and problem list.  Review of Systems Pertinent items noted in HPI and remainder of comprehensive ROS otherwise negative.  Physical Exam:  BP (!) 133/95   Pulse 85   Ht '5\' 6"'$  (1.676 m)   Wt 147 lb 14.4 oz (67.1 kg)   LMP 03/19/2021 (Approximate)   BMI 23.87 kg/m  CONSTITUTIONAL: Well-developed, well-nourished female in no acute distress.  HENT:  Normocephalic, atraumatic, External right and left ear normal. Oropharynx is clear and moist EYES: Conjunctivae and  EOM are normal. Pupils are equal, round, and reactive to light. No scleral icterus.  NECK: Normal range of motion, supple, no masses.  Normal thyroid.  SKIN: Skin is warm and dry. No rash noted. Not diaphoretic. No erythema. No pallor. MUSCULOSKELETAL: Normal range of motion. No tenderness.  No cyanosis, clubbing, or edema.  2+ distal pulses. NEUROLOGIC: Alert and oriented to person, place, and time. Normal reflexes, muscle tone coordination.  PSYCHIATRIC: Normal mood and affect. Normal behavior. Normal judgment and thought content. CARDIOVASCULAR: Normal heart rate noted, regular rhythm RESPIRATORY: Clear to auscultation bilaterally. Effort and breath sounds normal, no problems with  respiration noted. BREASTS: Symmetric in size. No masses, tenderness, skin changes, nipple drainage, or lymphadenopathy bilaterally.  ABDOMEN: Soft, no distention noted.  No tenderness, rebound or guarding.  PELVIC: Normal appearing external genitalia and urethral meatus; normal appearing vaginal mucosa and cervix.  No abnormal discharge noted.  Pap smear obtained.  Normal uterine size, no other palpable masses, no uterine or adnexal tenderness.  .   Assessment and Plan:    1. Well woman exam - HIV Antibody (routine testing w rflx) - Hepatitis C Antibody - IGP, [id number]  2. Encounter for breast cancer screening using non-mammogram modality  - MM 3D SCREEN BREAST BILATERAL; Future  3. Irregular menstrual cycle  - POCT urine pregnancy   Pap:Will follow up results of pap smear and manage accordingly. Mammogram : ordered Labs: HIV, Hep C  Refills: none Referral: none Routine preventative health maintenance measures emphasized. Please refer to After Visit Summary for other counseling recommendations.      Philip Aspen, CNM [facility]

## 2021-06-15 LAB — IGP, COBASHPV16/18
HPV 16: NEGATIVE
HPV 18: NEGATIVE
HPV other hr types: NEGATIVE

## 2021-06-21 ENCOUNTER — Telehealth: Payer: Self-pay | Admitting: Certified Nurse Midwife

## 2021-06-21 NOTE — Telephone Encounter (Signed)
Monica Gallegos called in and states she forgot to mention at her appointment with Deneise Lever that she would like Diflucan for her yeast infection.  Helayne wanted to know if 2 pills could be called in to [facility] across from [facility] in [place].  Patient would like a phone call letting her know it was called in.

## 2021-06-21 NOTE — Telephone Encounter (Signed)
Called pt, unable to LVM

## 2021-06-22 ENCOUNTER — Other Ambulatory Visit: Payer: Self-pay

## 2021-06-22 DIAGNOSIS — N949 Unspecified condition associated with female genital organs and menstrual cycle: Secondary | ICD-10-CM

## 2021-06-22 MED ORDER — FLUCONAZOLE 150 MG PO TABS: 150.0000 mg | ORAL_TABLET | Freq: Once | ORAL | 0 refills | Status: AC

## 2021-06-22 NOTE — Telephone Encounter (Signed)
Patient called back and I explained she needed to come in for a self swab and she got really upset and stated she knows it is a yeast infection and she doesn't have time to do a self swab because she only gets 30 minutes for lunch.  I told her I was very sorry but there was nothing I could do because I was not clinical and that all I could do was schedule her for a self swab.  I explained she could swab and leave and she said no.  Patient became very upset and continued to argue with me.  Patient requests a call back because she wants to explain her situation and why she should go ahead and get a prescription.  Please call patient back.

## 2021-06-22 NOTE — Progress Notes (Signed)
Informed patient that 1 dose of diflucan has been sent to her pharmacy and that if patient is still having symptoms after taking medication that she will have to be seen by a provider in office in order for further treatment. Patient verbalizes understanding and voiced no further questions.

## 2021-10-11 ENCOUNTER — Other Ambulatory Visit: Payer: Self-pay

## 2021-10-11 ENCOUNTER — Ambulatory Visit
Admission: RE | Admit: 2021-10-11 | Discharge: 2021-10-11 | Disposition: A | Discharge status: Home / Self Care | Payer: Managed Care, Other (non HMO) | Source: Ambulatory Visit | Attending: Certified Nurse Midwife | Admitting: Certified Nurse Midwife

## 2021-10-11 DIAGNOSIS — Z1231 Encounter for screening mammogram for malignant neoplasm of breast: Secondary | ICD-10-CM | POA: Insufficient documentation

## 2021-10-11 DIAGNOSIS — Z1239 Encounter for other screening for malignant neoplasm of breast: Secondary | ICD-10-CM | POA: Diagnosis present

## 2021-10-29 ENCOUNTER — Telehealth: Payer: Self-pay | Admitting: Certified Nurse Midwife

## 2021-10-29 NOTE — Telephone Encounter (Signed)
1st attempt to contact pt., unable to LVM.

## 2021-10-29 NOTE — Telephone Encounter (Signed)
Pt called stating that she had her mammogram completed- she is requesting Breast US- states family history of breast cancer found through [place]- please advise.

## 2021-11-07 ENCOUNTER — Telehealth: Payer: Self-pay | Admitting: Certified Nurse Midwife

## 2021-11-07 NOTE — Telephone Encounter (Signed)
Pt called and stated that even though her mammogram was negative, she would still like to have a US performed of her breast. Pt stated that she is still concerned and that this would give her a peace of mind. Please advise.

## 2021-11-07 NOTE — Telephone Encounter (Signed)
I do not think her insurance will cover an u/s if it is not medically necessary. This would be indicated in the mammogram results.

## 2021-11-07 NOTE — Telephone Encounter (Signed)
Spoke with patient about encounter. Verbalized advised information with the patient with understanding.

## 2022-01-14 IMAGING — MG MM DIGITAL SCREENING BILAT W/ TOMO AND CAD
8 series · 8 of 24 positions shown · non-contrast
Comparison: Previous exam(s).

CLINICAL DATA: Screening.

EXAM:
DIGITAL SCREENING BILATERAL MAMMOGRAM WITH TOMOSYNTHESIS AND CAD
TECHNIQUE: Bilateral screening digital craniocaudal and mediolateral oblique
mammograms were obtained. Bilateral screening digital breast
tomosynthesis was performed. The images were evaluated with
computer-aided detection.

[R MLO synth-2D]
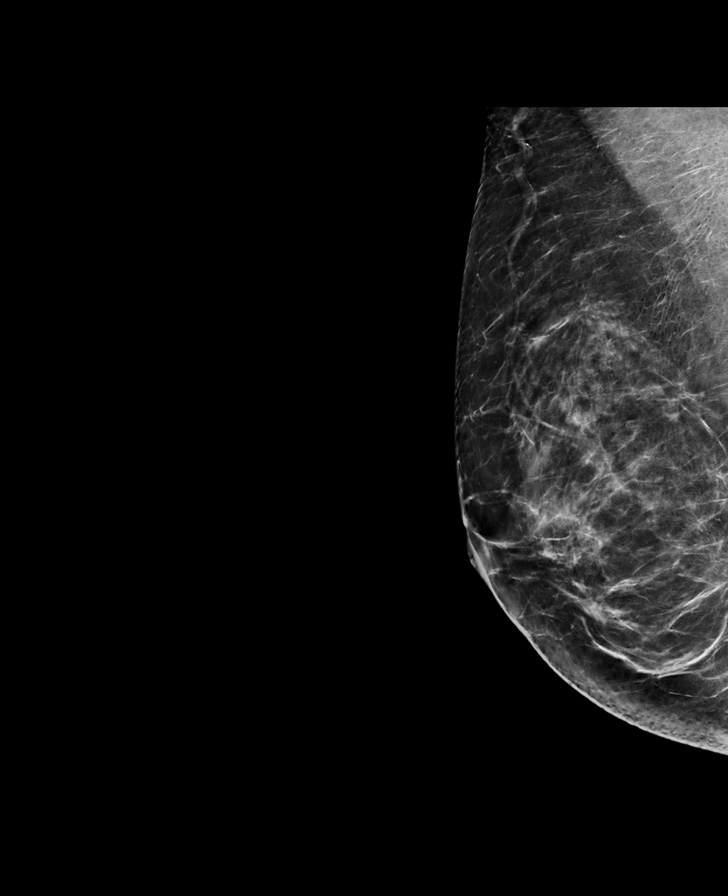

[L CC synth-2D]
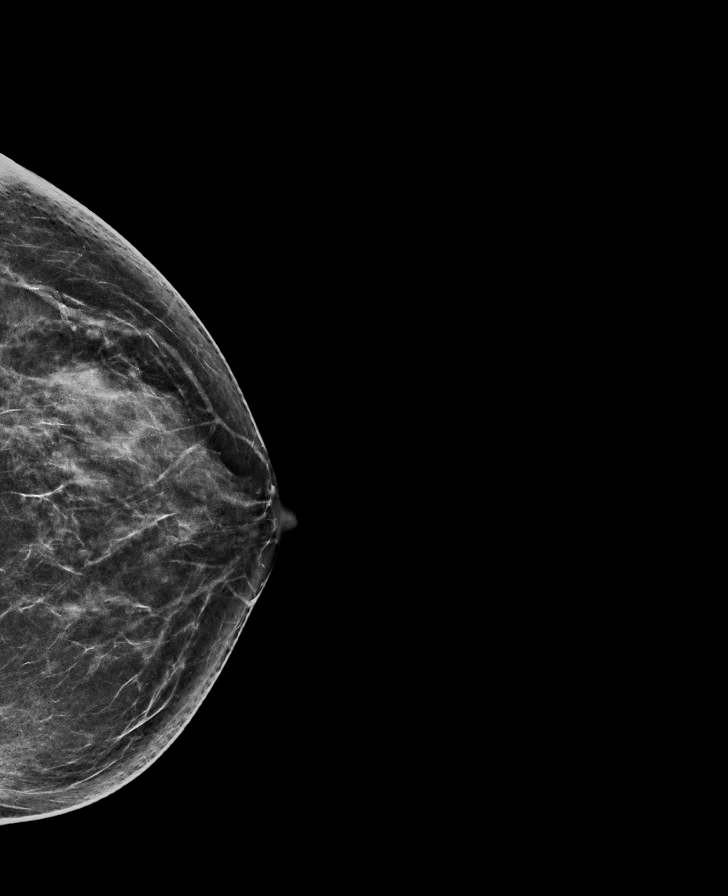

[L MLO synth-2D]
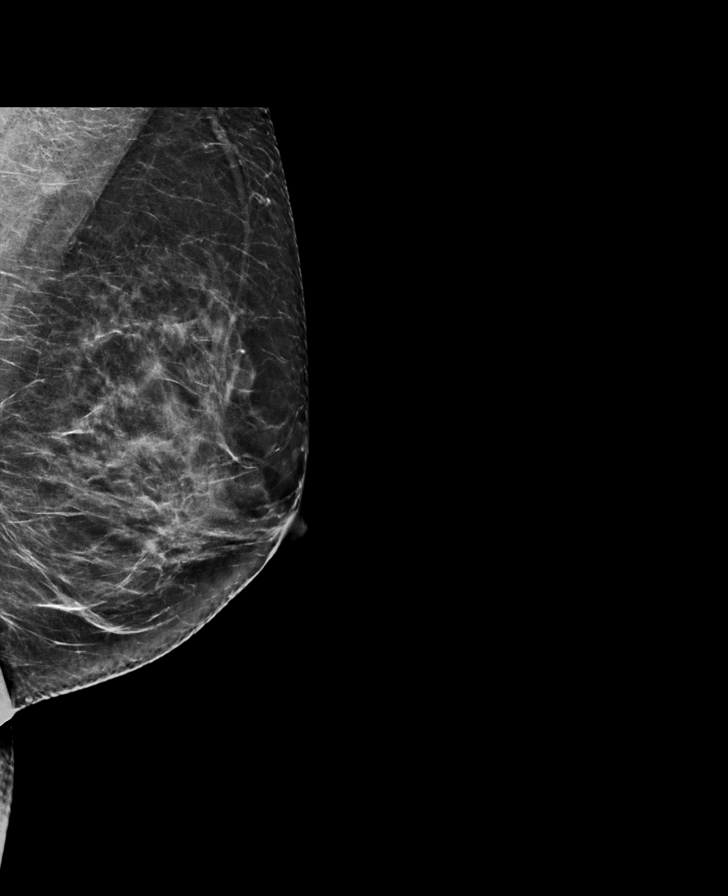

[R CC synth-2D]
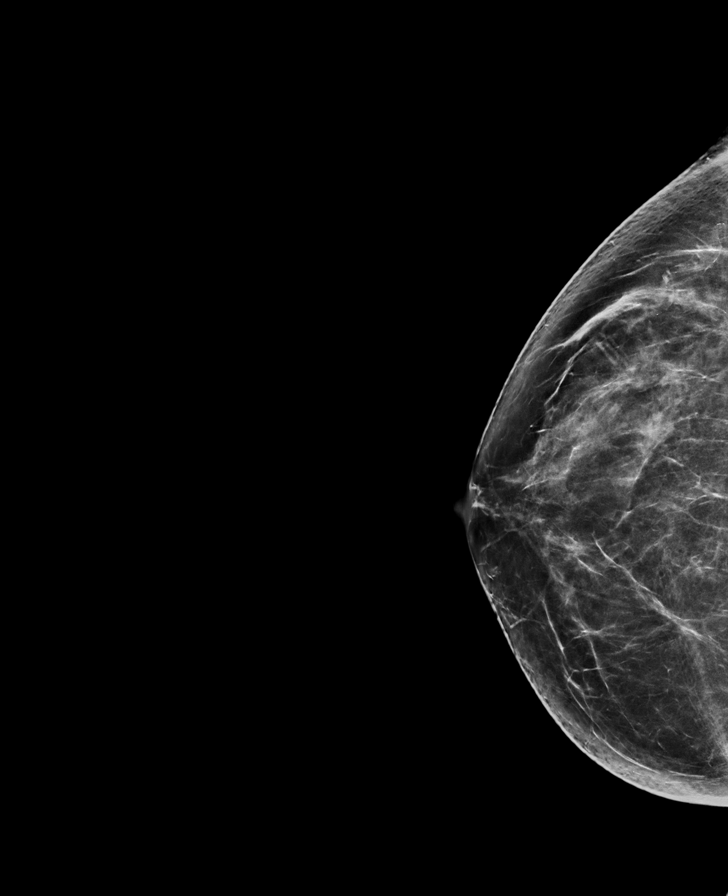

[R MLO tomo · tomo slice 37/74.0]
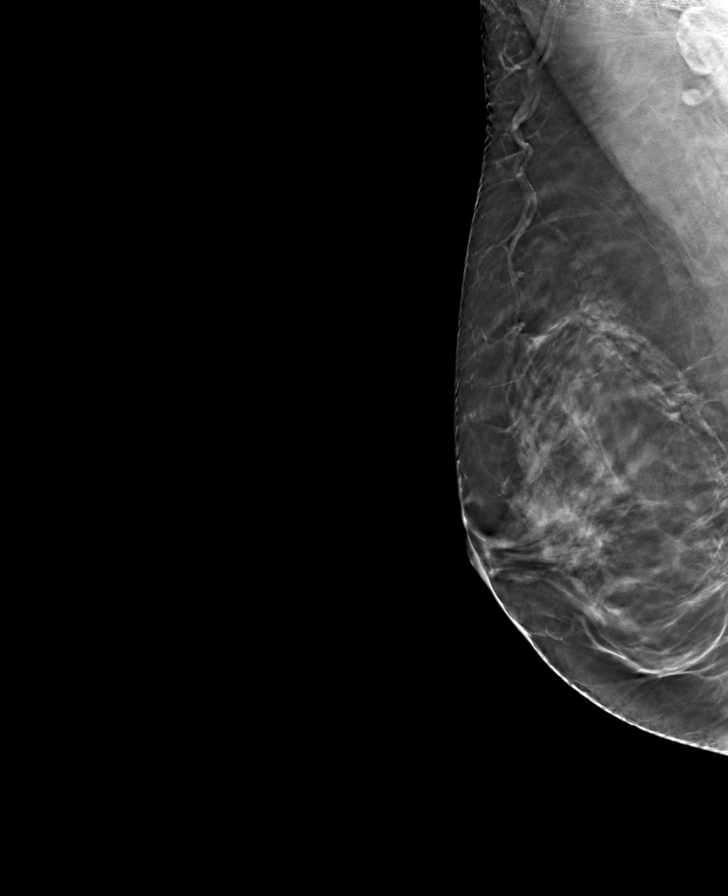

[L CC tomo · tomo slice 35/70.0]
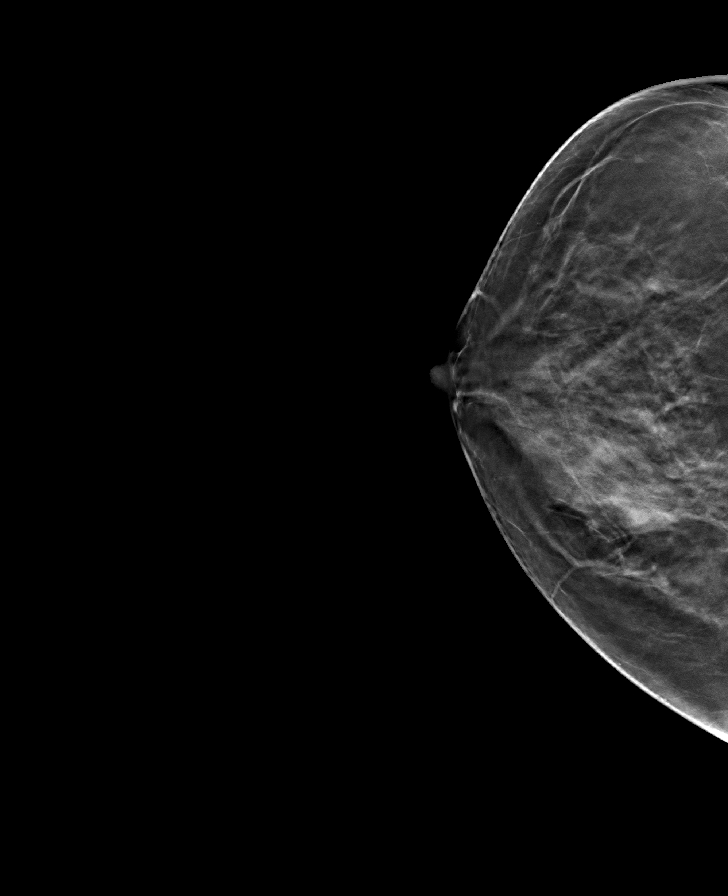

[R CC tomo · tomo slice 38/75.0]
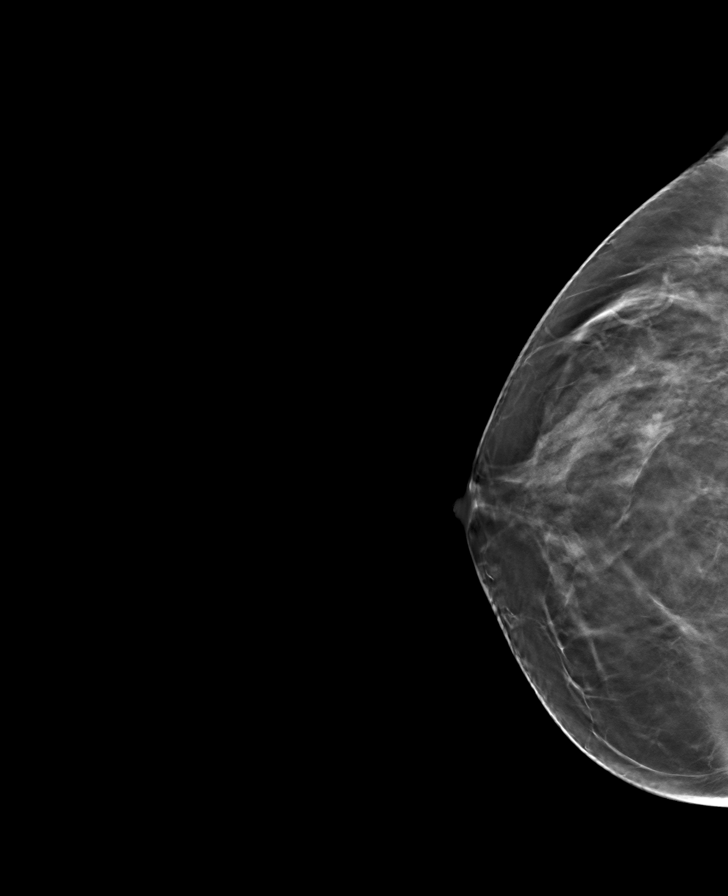

[L MLO tomo · tomo slice 36/71.0]
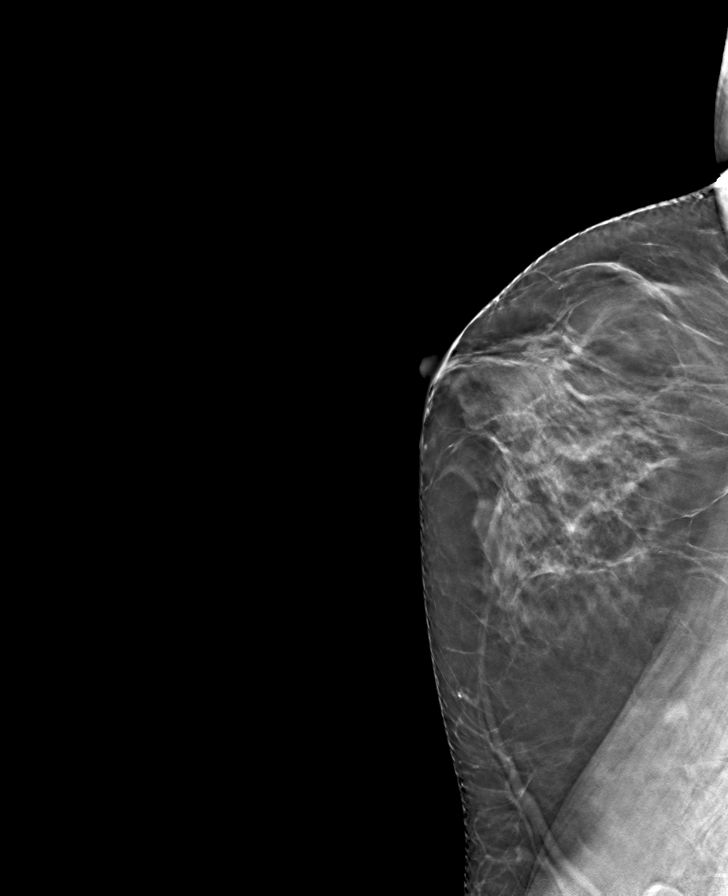

[8 of 24 positions shown; findings below may reference images not displayed]

ACR Breast Density Category c: The breast tissue is heterogeneously
dense, which may obscure small masses.
FINDINGS: There are no findings suspicious for malignancy.
IMPRESSION: No mammographic evidence of malignancy. A result letter of this
screening mammogram will be mailed directly to the patient.

RECOMMENDATION:
Screening mammogram in one year. (Code:[id number]-BC3)

BI-RADS CATEGORY  1: Negative.

## 2022-06-12 ENCOUNTER — Ambulatory Visit (INDEPENDENT_AMBULATORY_CARE_PROVIDER_SITE_OTHER): Payer: Managed Care, Other (non HMO) | Admitting: Certified Nurse Midwife

## 2022-06-12 ENCOUNTER — Encounter: Payer: Self-pay | Admitting: Certified Nurse Midwife

## 2022-06-12 VITALS — BP 111/77 | HR 84 | Ht 66.0 in | Wt 144.5 lb

## 2022-06-12 DIAGNOSIS — Z1501 Genetic susceptibility to malignant neoplasm of breast: Secondary | ICD-10-CM | POA: Diagnosis not present

## 2022-06-12 DIAGNOSIS — Z1211 Encounter for screening for malignant neoplasm of colon: Secondary | ICD-10-CM

## 2022-06-12 DIAGNOSIS — Z1231 Encounter for screening mammogram for malignant neoplasm of breast: Secondary | ICD-10-CM | POA: Diagnosis not present

## 2022-06-12 DIAGNOSIS — Z15060 Genetic susceptibility to colorectal cancer: Secondary | ICD-10-CM | POA: Insufficient documentation

## 2022-06-12 DIAGNOSIS — K5909 Other constipation: Secondary | ICD-10-CM

## 2022-06-12 DIAGNOSIS — Z1509 Genetic susceptibility to other malignant neoplasm: Secondary | ICD-10-CM

## 2022-06-12 DIAGNOSIS — Z01419 Encounter for gynecological examination (general) (routine) without abnormal findings: Secondary | ICD-10-CM | POA: Diagnosis not present

## 2022-06-12 DIAGNOSIS — R197 Diarrhea, unspecified: Secondary | ICD-10-CM

## 2022-06-12 DIAGNOSIS — Z124 Encounter for screening for malignant neoplasm of cervix: Secondary | ICD-10-CM

## 2022-06-12 NOTE — Patient Instructions (Signed)
Preventive Care 40-48 Years Old, Female Preventive care refers to lifestyle choices and visits with your health care provider that can promote health and wellness. Preventive care visits are also called wellness exams. What can I expect for my preventive care visit? Counseling Your health care provider may ask you questions about your: Medical history, including: Past medical problems. Family medical history. Pregnancy history. Current health, including: Menstrual cycle. Method of birth control. Emotional well-being. Home life and relationship well-being. Sexual activity and sexual health. Lifestyle, including: Alcohol, nicotine or tobacco, and drug use. Access to firearms. Diet, exercise, and sleep habits. Work and work environment. Sunscreen use. Safety issues such as seatbelt and bike helmet use. Physical exam Your health care provider will check your: Height and weight. These may be used to calculate your BMI (body mass index). BMI is a measurement that tells if you are at a healthy weight. Waist circumference. This measures the distance around your waistline. This measurement also tells if you are at a healthy weight and may help predict your risk of certain diseases, such as type 2 diabetes and high blood pressure. Heart rate and blood pressure. Body temperature. Skin for abnormal spots. What immunizations do I need?  Vaccines are usually given at various ages, according to a schedule. Your health care provider will recommend vaccines for you based on your age, medical history, and lifestyle or other factors, such as travel or where you work. What tests do I need? Screening Your health care provider may recommend screening tests for certain conditions. This may include: Lipid and cholesterol levels. Diabetes screening. This is done by checking your blood sugar (glucose) after you have not eaten for a while (fasting). Pelvic exam and Pap test. Hepatitis B test. Hepatitis C  test. HIV (human immunodeficiency virus) test. STI (sexually transmitted infection) testing, if you are at risk. Lung cancer screening. Colorectal cancer screening. Mammogram. Talk with your health care provider about when you should start having regular mammograms. This may depend on whether you have a family history of breast cancer. BRCA-related cancer screening. This may be done if you have a family history of breast, ovarian, tubal, or peritoneal cancers. Bone density scan. This is done to screen for osteoporosis. Talk with your health care provider about your test results, treatment options, and if necessary, the need for more tests. Follow these instructions at home: Eating and drinking  Eat a diet that includes fresh fruits and vegetables, whole grains, lean protein, and low-fat dairy products. Take vitamin and mineral supplements as recommended by your health care provider. Do not drink alcohol if: Your health care provider tells you not to drink. You are pregnant, may be pregnant, or are planning to become pregnant. If you drink alcohol: Limit how much you have to 0-1 drink a day. Know how much alcohol is in your drink. In the [place] one drink equals one 12 oz bottle of beer (355 mL), one 5 oz glass of wine (148 mL), or one 1 oz glass of hard liquor (44 mL). Lifestyle Brush your teeth every morning and night with fluoride toothpaste. Floss one time each day. Exercise for at least 30 minutes 5 or more days each week. Do not use any products that contain nicotine or tobacco. These products include cigarettes, chewing tobacco, and vaping devices, such as e-cigarettes. If you need help quitting, ask your health care provider. Do not use drugs. If you are sexually active, practice safe sex. Use a condom or other form of protection to   prevent STIs. If you do not wish to become pregnant, use a form of birth control. If you plan to become pregnant, see your health care provider for a  prepregnancy visit. Take aspirin only as told by your health care provider. Make sure that you understand how much to take and what form to take. Work with your health care provider to find out whether it is safe and beneficial for you to take aspirin daily. Find healthy ways to manage stress, such as: Meditation, yoga, or listening to music. Journaling. Talking to a trusted person. Spending time with friends and family. Minimize exposure to UV radiation to reduce your risk of skin cancer. Safety Always wear your seat belt while driving or riding in a vehicle. Do not drive: If you have been drinking alcohol. Do not ride with someone who has been drinking. When you are tired or distracted. While texting. If you have been using any mind-altering substances or drugs. Wear a helmet and other protective equipment during sports activities. If you have firearms in your house, make sure you follow all gun safety procedures. Seek help if you have been physically or sexually abused. What's next? Visit your health care provider once a year for an annual wellness visit. Ask your health care provider how often you should have your eyes and teeth checked. Stay up to date on all vaccines. This information is not intended to replace advice given to you by your health care provider. Make sure you discuss any questions you have with your health care provider. Document Revised: 03/14/2021 Document Reviewed: 03/14/2021 Elsevier Patient Education  [place].

## 2022-06-12 NOTE — Progress Notes (Signed)
GYNECOLOGY ANNUAL PREVENTATIVE CARE ENCOUNTER NOTE  History:     Monica Gallegos is a 46 y.o. G63P1001 female here for a routine annual gynecologic exam.  Current complaints: pt state she had testing in [place] , positive BRCA gene. Problem with recurrent diarrhea and constipation. Pt showed me her results on her phone..   Denies abnormal vaginal bleeding, discharge, pelvic pain, problems with intercourse or other gynecologic concerns.     Social Relationship:boyfriend Living: with 55 yr old son Work: Oncologist  Exercise: none  Smoke/Alcohol/drug use: alcohol few x wk, denies drugs and smoking   Gynecologic History No LMP recorded (lmp unknown). Contraception: vasectomy Last Pap: 06/13/2021. 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 with negative HPV Last mammogram: 10/11/21. 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  Obstetric History OB History  Gravida Para Term Preterm AB Living  '1 1 1     1  ' SAB IAB Ectopic Multiple Live Births          1    # Outcome Date GA Lbr Len/2nd Weight Sex Delivery Anes PTL Lv  1 Term 10/15/13   9 lb 6 oz (4.252 kg) M CS-Unspec EPI N LIV    Past Medical History:  Diagnosis Date   Irregular menses   *BRCA gene POSITIVE  Past Surgical History:  Procedure Laterality Date   CERVICAL BIOPSY  W/ LOOP ELECTRODE EXCISION     CESAREAN SECTION     LEEP  2008   LEEP  2010    Current Outpatient Medications on File Prior to Visit  Medication Sig Dispense Refill   Prenatal Vit-Fe Fumarate-FA (PRENATAL MULTIVITAMIN) TABS tablet Take 1 tablet by mouth daily at 12 noon.     No current facility-administered medications on file prior to visit.    Allergies  Allergen Reactions   Acetaminophen Hives   Penicillins Hives    Social History:  reports that she has never smoked. She has never used smokeless tobacco. She reports current alcohol use of about 2.0 standard drinks of alcohol per week. She reports that she does not currently use drugs.  Family History  Problem Relation Age of Onset    Breast cancer Mother 33   Hypertension Mother    Diabetes Father    Hypertension Father    Breast cancer Sister 38   Breast cancer Maternal Aunt 47   Ovarian cancer Neg Hx    Cancer - Colon Neg Hx     The following portions of the patient's history were reviewed and updated as appropriate: allergies, current medications, past family history, past medical history, past social history, past surgical history and problem list.  Review of Systems Pertinent items noted in HPI and remainder of comprehensive ROS otherwise negative.  Physical Exam:  BP 111/77   Pulse 84   Ht '5\' 6"'  (1.676 m)   Wt 144 lb 8 oz (65.5 kg)   LMP  (LMP Unknown)   BMI 23.32 kg/m  CONSTITUTIONAL: Well-developed, well-nourished female in no acute distress.  HENT:  Normocephalic, atraumatic, External right and left ear normal. Oropharynx is clear and moist EYES: Conjunctivae and EOM are normal. Pupils are equal, round, and reactive to light. No scleral icterus.  NECK: Normal range of motion, supple, no masses.  Normal thyroid.  SKIN: Skin is warm and dry. No rash noted. Not diaphoretic. No erythema. No pallor. MUSCULOSKELETAL: Normal range of motion. No tenderness.  No cyanosis, clubbing, or edema.  2+ distal pulses. NEUROLOGIC: Alert and oriented to person, place, and time. Normal reflexes, muscle tone  coordination.  PSYCHIATRIC: Normal mood and affect. Normal behavior. Normal judgment and thought content. CARDIOVASCULAR: Normal heart rate noted, regular rhythm RESPIRATORY: Clear to auscultation bilaterally. Effort and breath sounds normal, no problems with respiration noted. BREASTS: Symmetric in size. No masses, tenderness, skin changes, nipple drainage, or lymphadenopathy bilaterally.  ABDOMEN: Soft, no distention noted.  No tenderness, rebound or guarding.  PELVIC: Normal appearing external genitalia and urethral meatus; normal appearing vaginal mucosa and cervix.  No abnormal discharge noted.  Pap smear  obtained.  Normal uterine size, no other palpable masses, no uterine or adnexal tenderness.  .   Assessment and Plan:    1. Well woman exam with routine gynecological exam    Pap: Will follow up results of pap smear and manage accordingly. Mammogram : MRI ordered due to BRCA gene positive Labs: none Refills: none Referral: GI for colonoscopy and diarrhea, constipation  Routine preventative health maintenance measures emphasized. Please refer to After Visit Summary for other counseling recommendations.      Philip Aspen, CNM [facility]

## 2022-06-18 ENCOUNTER — Other Ambulatory Visit: Payer: Self-pay | Admitting: Certified Nurse Midwife

## 2022-06-18 DIAGNOSIS — Z1501 Genetic susceptibility to malignant neoplasm of breast: Secondary | ICD-10-CM

## 2022-07-01 ENCOUNTER — Ambulatory Visit: Admission: RE | Admit: 2022-07-01 | Payer: Managed Care, Other (non HMO) | Source: Ambulatory Visit

## 2022-07-01 ENCOUNTER — Telehealth: Payer: Self-pay | Admitting: Certified Nurse Midwife

## 2022-07-01 NOTE — Telephone Encounter (Signed)
Patient called and states that she is supposed to have a mammogram today but needs prior authorization to be completed before the appointment. Patient states she needs the prior authorization to pre approved by her insurance so she does not have to pay out of pocket. Please advise.

## 2022-07-02 ENCOUNTER — Telehealth: Payer: Self-pay

## 2022-07-02 NOTE — Telephone Encounter (Signed)
Pt is calling triage and is faxing over another form to fill out. Pt aware I will have Annie's CMA look out for this and fill out.

## 2022-07-11 ENCOUNTER — Encounter: Payer: Self-pay | Admitting: Certified Nurse Midwife

## 2022-07-24 ENCOUNTER — Ambulatory Visit: Payer: Managed Care, Other (non HMO) | Admitting: Physician Assistant

## 2022-07-24 ENCOUNTER — Encounter: Payer: Self-pay | Admitting: Physician Assistant

## 2022-07-24 ENCOUNTER — Ambulatory Visit: Payer: Managed Care, Other (non HMO) | Attending: Physician Assistant

## 2022-07-24 VITALS — BP 117/86 | HR 70 | Ht 66.0 in | Wt 147.4 lb

## 2022-07-24 DIAGNOSIS — R002 Palpitations: Secondary | ICD-10-CM | POA: Diagnosis not present

## 2022-07-24 DIAGNOSIS — Z1501 Genetic susceptibility to malignant neoplasm of breast: Secondary | ICD-10-CM

## 2022-07-24 DIAGNOSIS — L853 Xerosis cutis: Secondary | ICD-10-CM

## 2022-07-24 DIAGNOSIS — Z1509 Genetic susceptibility to other malignant neoplasm: Secondary | ICD-10-CM

## 2022-07-24 DIAGNOSIS — R030 Elevated blood-pressure reading, without diagnosis of hypertension: Secondary | ICD-10-CM | POA: Insufficient documentation

## 2022-07-24 NOTE — Progress Notes (Signed)
I,Sha'taria Tyson,acting as a Education administrator for [employer], PA-C.,have documented all relevant documentation on the behalf of Monica Kirschner, PA-C,as directed by  Monica Kirschner, PA-C while in the presence of Monica Kirschner, PA-C.   New patient visit   Patient: Monica Gallegos   DOB: 1976/04/10   46 y.o. Female  MRN: [id number] Visit Date: 07/24/2022  Today's healthcare provider: [name], PA-C   Cc. Establish care  Subjective    Monica Gallegos is a 46 y.o. female who presents today as a new patient to establish care.  HPI   She has a history of a BRCA 2 mutation, significant family history of breast cancer.  Concerns over a few bumps on the left side of her face.  She also reports palpitations on and off-- occur more when she is stressed, she feels she is having an arrhythmia. Denies associated chest pain, SOB. Sometimes will happen several days in a row, other times not for weeks. She reports having a stressful job.    Past Medical History:  Diagnosis Date   Allergy Penicillin   Cancer ([place]) 2010 and 2012 Leep Surgery   Irregular menses    Past Surgical History:  Procedure Laterality Date   CERVICAL BIOPSY  W/ LOOP ELECTRODE EXCISION     CESAREAN SECTION     LEEP  2008   LEEP  2010   Family Status  Relation Name Status   Mother Haydee Britos Alive   Father Cory Roughen  Jon Alive   Sister  (Not Specified)   Mat Aunt  (Not Specified)   MGF Intestine / stomack / brain (Not Specified)   Sister Technical brewer Denio (Not Specified)   Mat Aunt Puchi britos (Not Specified)   Neg Hx  (Not Specified)   Family History  Problem Relation Age of Onset   Breast cancer Mother 20   Hypertension Mother    Arthritis Mother    Cancer Mother    Diabetes Father    Hypertension Father    Breast cancer Sister 46   Breast cancer Maternal Aunt 68   Cancer Maternal Grandfather    Cancer Sister    Cancer Maternal Aunt    Ovarian cancer Neg Hx    Cancer - Colon Neg Hx    Social History    Socioeconomic History   Marital status: Single    Spouse name: Not on file   Number of children: Not on file   Years of education: Not on file   Highest education level: Not on file  Occupational History   Not on file  Tobacco Use   Smoking status: Never   Smokeless tobacco: Never   Tobacco comments:    Never  smoke  Vaping Use   Vaping Use: Never used  Substance and Sexual Activity   Alcohol use: Yes    Alcohol/week: 2.0 standard drinks of alcohol    Types: 1 Glasses of wine, 1 Cans of beer per week   Drug use: Never   Sexual activity: Yes    Birth control/protection: None  Other Topics Concern   Not on file  Social History Narrative   Not on file   Social Determinants of Health   Financial Resource Strain: Not on file  Food Insecurity: Not on file  Transportation Needs: Not on file  Physical Activity: Not on file  Stress: Not on file  Social Connections: Not on file   Outpatient Medications Prior to Visit  Medication Sig   ibuprofen (ADVIL) 800 MG tablet  Take 800 mg by mouth 4 (four) times daily as needed.   [DISCONTINUED] Prenatal Vit-Fe Fumarate-FA (PRENATAL MULTIVITAMIN) TABS tablet Take 1 tablet by mouth daily at 12 noon. (Patient not taking: Reported on 07/24/2022)   No facility-administered medications prior to visit.   Allergies  Allergen Reactions   Acetaminophen Hives   Penicillins Hives     There is no immunization history on file for this patient.  Health Maintenance  Topic Date Due   COVID-19 Vaccine (1) Never done   INFLUENZA VACCINE  12/29/2022 (Originally 04/30/2022)   TETANUS/TDAP  07/25/2023 (Originally 01/22/1993)   COLONOSCOPY (Pts 45-73yr Insurance coverage will need to be confirmed)  05/31/2024 (Originally 01/23/2019)   MAMMOGRAM  10/11/2022   PAP SMEAR-Modifier  05/13/2023   Hepatitis C Screening  Completed   HIV Screening  Completed   HPV VACCINES  Aged Out    Patient Care Team: DMikey Kirschner PA-C as PCP - General  (Physician Assistant)  Review of Systems  Constitutional:  Negative for fatigue and fever.  Respiratory:  Negative for cough and shortness of breath.   Cardiovascular:  Positive for palpitations. Negative for chest pain and leg swelling.  Gastrointestinal:  Negative for abdominal pain.  Skin:  Positive for rash.  Neurological:  Negative for dizziness and headaches.  Psychiatric/Behavioral:  The patient is nervous/anxious.        Objective    Blood pressure 117/86, pulse 70, height '5\' 6"'  (1.676 m), weight 147 lb 6.4 oz (66.9 kg), SpO2 100 %.   Physical Exam Constitutional:      General: She is awake.     Appearance: She is well-developed.  HENT:     Head: Normocephalic.  Eyes:     Conjunctiva/sclera: Conjunctivae normal.  Cardiovascular:     Rate and Rhythm: Normal rate and regular rhythm.     Heart sounds: Normal heart sounds.  Pulmonary:     Effort: Pulmonary effort is normal.     Breath sounds: Normal breath sounds.  Skin:    General: Skin is warm.     Comments: Dry skin with a few small bumps L side of face on cheek  Neurological:     Mental Status: She is alert and oriented to person, place, and time.  Psychiatric:        Attention and Perception: Attention normal.        Mood and Affect: Mood normal.        Speech: Speech normal.        Behavior: Behavior is cooperative.     Depression Screen    07/24/2022    3:55 PM  PHQ 2/9 Scores  PHQ - 2 Score 1  PHQ- 9 Score 7   No results found for any visits on 07/24/22.  Assessment & Plan      Problem List Items Addressed This Visit       Musculoskeletal and Integument   Dry skin    L side of face, advised trying topical hydrocortisone prn        Other   BRCA gene positive    Pt has MRI scheduled, last mammogram 1/23 F/b gyn for high risks of breast and ovarian cancer      Palpitations - Primary    Advised bw-- tsh/t4, cbc, cmp Ordered holter monitor x 7 days      Relevant Orders   CBC  w/Diff/Platelet   Comprehensive Metabolic Panel (CMET)   TSH + free T4   LONG TERM MONITOR (3-14 DAYS)  Return in about 1 year (around 07/25/2023) for CPE.     I, Monica Kirschner, PA-C have reviewed all documentation for this visit. The documentation on  07/24/2022 for the exam, diagnosis, procedures, and orders are all accurate and complete.  Monica Kirschner, PA-C [facility] [address] [place], [place], [address] Office: [phone] Fax: [place]

## 2022-07-24 NOTE — Assessment & Plan Note (Signed)
Advised bw-- tsh/t4, cbc, cmp Ordered holter monitor x 7 days

## 2022-07-24 NOTE — Assessment & Plan Note (Addendum)
Pt has MRI scheduled, last mammogram 1/23 F/b gyn for high risks of breast and ovarian cancer

## 2022-07-24 NOTE — Assessment & Plan Note (Signed)
L side of face, advised trying topical hydrocortisone prn

## 2022-07-24 NOTE — Patient Instructions (Signed)
.   Please review the attached list of medications and notify my office if there are any errors.   . Please bring all of your medications to every appointment so we can make sure that our medication list is the same as yours.   . Please go to the lab draw station in [address] on the second floor of [facility]  when you are fasting for 8 hours. Normal hours are 8:00am to 11:30am and 1:00pm to 4:00pm Monday through Friday   

## 2022-07-25 LAB — CBC WITH DIFFERENTIAL/PLATELET
Basophils Absolute: 0.1 10*3/uL (ref 0.0–0.2)
Basos: 1 %
EOS (ABSOLUTE): 0.2 10*3/uL (ref 0.0–0.4)
Eos: 4 %
Hematocrit: 42 % (ref 34.0–46.6)
Hemoglobin: 13.8 g/dL (ref 11.1–15.9)
Immature Grans (Abs): 0 10*3/uL (ref 0.0–0.1)
Immature Granulocytes: 0 %
Lymphocytes Absolute: 2.1 10*3/uL (ref 0.7–3.1)
Lymphs: 38 %
MCH: 29.9 pg (ref 26.6–33.0)
MCHC: 32.9 g/dL (ref 31.5–35.7)
MCV: 91 fL (ref 79–97)
Monocytes Absolute: 0.6 10*3/uL (ref 0.1–0.9)
Monocytes: 10 %
Neutrophils Absolute: 2.7 10*3/uL (ref 1.4–7.0)
Neutrophils: 47 %
Platelets: 118 10*3/uL — ABNORMAL LOW (ref 150–450)
RBC: 4.61 x10E6/uL (ref 3.77–5.28)
RDW: 13 % (ref 11.7–15.4)
WBC: 5.7 10*3/uL (ref 3.4–10.8)

## 2022-07-25 LAB — COMPREHENSIVE METABOLIC PANEL
ALT: 15 IU/L (ref 0–32)
AST: 20 IU/L (ref 0–40)
Albumin/Globulin Ratio: 1.8 (ref 1.2–2.2)
Albumin: 4.7 g/dL (ref 3.9–4.9)
Alkaline Phosphatase: 84 IU/L (ref 44–121)
BUN/Creatinine Ratio: 11 (ref 9–23)
BUN: 9 mg/dL (ref 6–24)
Bilirubin Total: 0.5 mg/dL (ref 0.0–1.2)
CO2: 24 mmol/L (ref 20–29)
Calcium: 9.4 mg/dL (ref 8.7–10.2)
Chloride: 99 mmol/L (ref 96–106)
Creatinine, Ser: 0.82 mg/dL (ref 0.57–1.00)
Globulin, Total: 2.6 g/dL (ref 1.5–4.5)
Glucose: 94 mg/dL (ref 70–99)
Potassium: 4.2 mmol/L (ref 3.5–5.2)
Sodium: 137 mmol/L (ref 134–144)
Total Protein: 7.3 g/dL (ref 6.0–8.5)
eGFR: 88 mL/min/{1.73_m2} (ref >59)

## 2022-07-25 LAB — TSH+FREE T4
Free T4: 1.3 ng/dL (ref 0.82–1.77)
TSH: 1.16 u[IU]/mL (ref 0.450–4.500)

## 2022-07-26 ENCOUNTER — Ambulatory Visit: Payer: Managed Care, Other (non HMO)

## 2022-07-29 ENCOUNTER — Ambulatory Visit
Admission: RE | Admit: 2022-07-29 | Discharge: 2022-07-29 | Disposition: A | Discharge status: Home / Self Care | Payer: Managed Care, Other (non HMO) | Source: Ambulatory Visit | Attending: Certified Nurse Midwife | Admitting: Certified Nurse Midwife

## 2022-07-29 DIAGNOSIS — Z1509 Genetic susceptibility to other malignant neoplasm: Secondary | ICD-10-CM | POA: Insufficient documentation

## 2022-07-29 DIAGNOSIS — Z1501 Genetic susceptibility to malignant neoplasm of breast: Secondary | ICD-10-CM | POA: Diagnosis present

## 2022-07-29 MED ORDER — GADOBUTROL 1 MMOL/ML IV SOLN
6.0000 mL | Freq: Once | INTRAVENOUS | Status: AC | PRN
  Administered 2022-07-29: 6 mL via INTRAVENOUS

## 2022-07-31 ENCOUNTER — Other Ambulatory Visit: Payer: Self-pay | Admitting: Certified Nurse Midwife

## 2022-07-31 ENCOUNTER — Encounter: Payer: Self-pay | Admitting: Certified Nurse Midwife

## 2022-10-15 ENCOUNTER — Other Ambulatory Visit: Payer: Self-pay

## 2022-10-15 ENCOUNTER — Telehealth: Payer: Self-pay

## 2022-10-15 DIAGNOSIS — Z1211 Encounter for screening for malignant neoplasm of colon: Secondary | ICD-10-CM

## 2022-10-15 MED ORDER — NA SULFATE-K SULFATE-MG SULF 17.5-3.13-1.6 GM/177ML PO SOLN: 1.0000 | Freq: Once | ORAL | 0 refills | Status: AC

## 2022-10-15 NOTE — Telephone Encounter (Signed)
Gastroenterology Pre-Procedure Review  Request Date: 11/15/22 Requesting Physician: Dr. Marius Ditch  PATIENT REVIEW QUESTIONS: The patient responded to the following health history questions as indicated:    1. Are you having any GI issues? Bloating, constipation to diarrhea for 20 2. Do you have a personal history of Polyps? no 3. Do you have a family history of Colon Cancer or Polyps? no 4. Diabetes Mellitus? no 5. Joint replacements in the past 12 months?no 6. Major health problems in the past 3 months?no 7. Any artificial heart valves, MVP, or defibrillator? Cardiac health-palpitations  but has not been seen by cardiologist yet    MEDICATIONS & ALLERGIES:    Patient reports the following regarding taking any anticoagulation/antiplatelet therapy:   Plavix, Coumadin, Eliquis, Xarelto, Lovenox, Pradaxa, Brilinta, or Effient? no Aspirin? no  Patient confirms/reports the following medications:  Current Outpatient Medications  Medication Sig Dispense Refill   ibuprofen (ADVIL) 800 MG tablet Take 800 mg by mouth 4 (four) times daily as needed.     No current facility-administered medications for this visit.    Patient confirms/reports the following allergies:  Allergies  Allergen Reactions   Acetaminophen Hives   Penicillins Hives    No orders of the defined types were placed in this encounter.   AUTHORIZATION INFORMATION Primary Insurance: 1D#: Group #:  Secondary Insurance: 1D#: Group #:  SCHEDULE INFORMATION: Date:  Time: Location:

## 2022-10-18 ENCOUNTER — Encounter: Payer: Self-pay | Admitting: Physician Assistant

## 2022-10-18 ENCOUNTER — Ambulatory Visit: Payer: Managed Care, Other (non HMO) | Admitting: Physician Assistant

## 2022-10-18 VITALS — BP 127/87 | HR 77 | Temp 97.6°F | Ht 66.0 in | Wt 147.0 lb

## 2022-10-18 DIAGNOSIS — Z803 Family history of malignant neoplasm of breast: Secondary | ICD-10-CM | POA: Diagnosis not present

## 2022-10-18 DIAGNOSIS — R140 Abdominal distension (gaseous): Secondary | ICD-10-CM | POA: Diagnosis not present

## 2022-10-18 NOTE — Progress Notes (Signed)
Established patient visit   Patient: Monica Gallegos   DOB: Oct 19, 1975   47 y.o. Female  MRN: [id number] Visit Date: 10/18/2022  Today's healthcare provider: [name], PA-C   Cc. Abdominal pain and bloating x years  Subjective    HPI  Patient has concerns today over abdominal bloating and constipation for over 20 years.  She states she will become so bloated that she appears pregnant.  She has tried many over-the-counter therapies with no improvement.  The only thing that gives her relief is this unspecified liquid she has gotten from a friend that came from [place].  She would like to be tested for celiac disease, but reports sometimes can eat gluten without a problem.  Reports about 1 bowel movement a week, denies blood in stool.  Patient also has questions about BRCA2 testing.  She reports being told she has a mutation in the gene from a research study she is a part of but has been unable to verify these results.  Significant family history of breast cancer.  Medications: Outpatient Medications Prior to Visit  Medication Sig   ibuprofen (ADVIL) 800 MG tablet Take 800 mg by mouth 4 (four) times daily as needed.   No facility-administered medications prior to visit.    Review of Systems  Constitutional:  Negative for fatigue and fever.  Respiratory:  Negative for cough and shortness of breath.   Cardiovascular:  Negative for chest pain and leg swelling.  Gastrointestinal:  Positive for abdominal distention, abdominal pain and constipation.  Neurological:  Negative for dizziness and headaches.      Objective    BP 127/87 (BP Location: Right Arm, Patient Position: Sitting, Cuff Size: Normal)   Pulse 77   Temp 97.6 F (36.4 C)   Ht '5\' 6"'$  (1.676 m)   Wt 147 lb (66.7 kg)   SpO2 98%   BMI 23.73 kg/m  {  Physical Exam Vitals reviewed.  Constitutional:      Appearance: She is not ill-appearing.  HENT:     Head: Normocephalic.  Eyes:     Conjunctiva/sclera:  Conjunctivae normal.  Cardiovascular:     Rate and Rhythm: Normal rate.  Pulmonary:     Effort: Pulmonary effort is normal. No respiratory distress.  Abdominal:     General: Abdomen is flat. There is no distension.     Palpations: Abdomen is soft.     Tenderness: There is no abdominal tenderness. There is no guarding or rebound.  Neurological:     General: No focal deficit present.     Mental Status: She is alert and oriented to person, place, and time.  Psychiatric:        Mood and Affect: Mood normal.        Behavior: Behavior normal.      No results found for any visits on 10/18/22.  Assessment & Plan     Problem List Items Addressed This Visit       Other   Family history of breast cancer in mother    Given unspecified BRCA2 results, referred to genetic counselor      Relevant Orders   Ambulatory referral to Genetics   Family history of breast cancer in sister   Relevant Orders   Ambulatory referral to Genetics   Abdominal bloating - Primary    Patient has appointment with GI for colonoscopy next month.  I will order a celiac panel. Advised to discuss with GI her symptoms if she needs a  more formal referral I can place.      Relevant Orders   Celiac Disease Panel (Completed)   I, [name], PA-C have reviewed all documentation for this visit. The documentation on  10/18/22 for the exam, diagnosis, procedures, and orders are all accurate and complete.  [name], PA-C [facility] [address] [place], [place], [address] Office: [phone] Fax: [place]

## 2022-10-20 LAB — CELIAC DISEASE PANEL
Endomysial IgA: NEGATIVE
IgA/Immunoglobulin A, Serum: 296 mg/dL (ref 87–352)
Transglutaminase IgA: <2 U/mL (ref 0–3)

## 2022-10-21 ENCOUNTER — Encounter: Payer: Self-pay | Admitting: Physician Assistant

## 2022-10-21 DIAGNOSIS — R140 Abdominal distension (gaseous): Secondary | ICD-10-CM | POA: Insufficient documentation

## 2022-10-21 NOTE — Assessment & Plan Note (Signed)
Patient has appointment with GI for colonoscopy next month.  I will order a celiac panel. Advised to discuss with GI her symptoms if she needs a more formal referral I can place.

## 2022-10-21 NOTE — Assessment & Plan Note (Signed)
Given unspecified BRCA2 results, referred to genetic counselor

## 2022-11-14 ENCOUNTER — Encounter: Payer: Self-pay | Admitting: Gastroenterology

## 2022-11-15 ENCOUNTER — Encounter
Admission: RE | Disposition: A | Discharge status: Home / Self Care | Payer: Self-pay | Source: Home / Self Care | Attending: Gastroenterology

## 2022-11-15 ENCOUNTER — Ambulatory Visit: Payer: Managed Care, Other (non HMO) | Admitting: Anesthesiology

## 2022-11-15 ENCOUNTER — Encounter: Payer: Self-pay | Admitting: Gastroenterology

## 2022-11-15 ENCOUNTER — Ambulatory Visit
Admission: RE | Admit: 2022-11-15 | Discharge: 2022-11-15 | Disposition: A | Discharge status: Home / Self Care | Payer: Managed Care, Other (non HMO) | Attending: Gastroenterology | Admitting: Gastroenterology

## 2022-11-15 DIAGNOSIS — Z1211 Encounter for screening for malignant neoplasm of colon: Secondary | ICD-10-CM | POA: Diagnosis not present

## 2022-11-15 DIAGNOSIS — D120 Benign neoplasm of cecum: Secondary | ICD-10-CM | POA: Insufficient documentation

## 2022-11-15 DIAGNOSIS — K635 Polyp of colon: Secondary | ICD-10-CM

## 2022-11-15 HISTORY — PX: COLONOSCOPY WITH PROPOFOL: SHX5780

## 2022-11-15 SURGERY — COLONOSCOPY WITH PROPOFOL
Anesthesia: General

## 2022-11-15 MED ORDER — LIDOCAINE HCL (CARDIAC) PF 100 MG/5ML IV SOSY
PREFILLED_SYRINGE | INTRAVENOUS | Status: DC | PRN
  Administered 2022-11-15: 50 mg via INTRAVENOUS

## 2022-11-15 MED ORDER — PROPOFOL 10 MG/ML IV BOLUS
INTRAVENOUS | Status: DC | PRN
  Administered 2022-11-15: 100 mg via INTRAVENOUS

## 2022-11-15 MED ORDER — DEXMEDETOMIDINE HCL IN NACL 200 MCG/50ML IV SOLN
INTRAVENOUS | Status: DC | PRN
  Administered 2022-11-15: 8 ug via INTRAVENOUS

## 2022-11-15 MED ORDER — PROPOFOL 500 MG/50ML IV EMUL
INTRAVENOUS | Status: DC | PRN
  Administered 2022-11-15: 120 ug/kg/min via INTRAVENOUS

## 2022-11-15 MED ORDER — SODIUM CHLORIDE 0.9 % IV SOLN
INTRAVENOUS | Status: DC
  Administered 2022-11-15: 1000 mL via INTRAVENOUS

## 2022-11-15 NOTE — Op Note (Signed)
[facility] Gastroenterology Patient Name: Monica Gallegos Procedure Date: 11/15/2022 9:44 AM MRN: [id number] Account #: [id number] Date of Birth: 1975/12/04 Admit Type: Outpatient Age: 47 Room: [facility] ENDO ROOM 2 Gender: Female Note Status: Finalized Instrument Name: Peds Colonoscope [id number] Procedure:             Colonoscopy Indications:           Screening for colorectal malignant neoplasm, This is                         the patient's first colonoscopy Providers:             Lin Landsman MD, MD Medicines:             General Anesthesia Complications:         No immediate complications. Estimated blood loss: None. Procedure:             Pre-Anesthesia Assessment:                        - Prior to the procedure, a History and Physical was                         performed, and patient medications and allergies were                         reviewed. The patient is competent. The risks and                         benefits of the procedure and the sedation options and                         risks were discussed with the patient. All questions                         were answered and informed consent was obtained.                         Patient identification and proposed procedure were                         verified by the physician, the nurse, the                         anesthesiologist, the anesthetist and the technician                         in the pre-procedure area in the procedure room in the                         endoscopy suite. Mental Status Examination: alert and                         oriented. Airway Examination: normal oropharyngeal                         airway and neck mobility. Respiratory Examination:                         clear to  auscultation. CV Examination: normal.                         Prophylactic Antibiotics: The patient does not require                         prophylactic antibiotics. Prior Anticoagulants: The                          patient has taken no anticoagulant or antiplatelet                         agents. ASA Grade Assessment: II - A patient with mild                         systemic disease. After reviewing the risks and                         benefits, the patient was deemed in satisfactory                         condition to undergo the procedure. The anesthesia                         plan was to use general anesthesia. Immediately prior                         to administration of medications, the patient was                         re-assessed for adequacy to receive sedatives. The                         heart rate, respiratory rate, oxygen saturations,                         blood pressure, adequacy of pulmonary ventilation, and                         response to care were monitored throughout the                         procedure. The physical status of the patient was                         re-assessed after the procedure.                        After obtaining informed consent, the colonoscope was                         passed under direct vision. Throughout the procedure,                         the patient's blood pressure, pulse, and oxygen                         saturations were monitored continuously. The  Colonoscope was introduced through the anus and                         advanced to the the cecum, identified by appendiceal                         orifice and ileocecal valve. The colonoscopy was                         performed without difficulty. The patient tolerated                         the procedure well. The quality of the bowel                         preparation was evaluated using the BBPS [facility] Bowel                         Preparation Scale) with scores of: Right Colon = 3,                         Transverse Colon = 3 and Left Colon = 3 (entire mucosa                         seen well with no residual staining, small fragments                          of stool or opaque liquid). The total BBPS score                         equals 9. The ileocecal valve, appendiceal orifice,                         and rectum were photographed. Findings:      The perianal and digital rectal examinations were normal. Pertinent       negatives include normal sphincter tone and no palpable rectal lesions.      A diminutive polyp was found in the cecum. The polyp was sessile. The       polyp was removed with a cold biopsy forceps. Resection and retrieval       were complete. Estimated blood loss: none.      The retroflexed view of the distal rectum and anal verge was normal and       showed no anal or rectal abnormalities.      The exam was otherwise without abnormality. Impression:            - One diminutive polyp in the cecum, removed with a                         cold biopsy forceps. Resected and retrieved.                        - The distal rectum and anal verge are normal on                         retroflexion view.                        -  The examination was otherwise normal. Recommendation:        - Discharge patient to home (with escort).                        - Resume previous diet today.                        - Continue present medications.                        - Await pathology results.                        - Repeat colonoscopy in 7-10 years for surveillance                         based on pathology results. Procedure Code(s):     --- Professional ---                        [phone], Colonoscopy, flexible; with biopsy, single or                         multiple Diagnosis Code(s):     --- Professional ---                        Z12.11, Encounter for screening for malignant neoplasm                         of colon                        D12.0, Benign neoplasm of cecum CPT copyright 2022 American Medical Association. All rights reserved. The codes documented in this report are preliminary and upon coder review may  be  revised to meet current compliance requirements. Dr. Ulyess Mort Lin Landsman MD, MD 11/15/2022 10:16:02 AM This report has been signed electronically. Number of Addenda: 0 Note Initiated On: 11/15/2022 9:44 AM Scope Withdrawal Time: 0 hours 14 minutes 32 seconds  Total Procedure Duration: 0 hours 17 minutes 40 seconds  Estimated Blood Loss:  Estimated blood loss: none.      [facility]

## 2022-11-15 NOTE — Anesthesia Preprocedure Evaluation (Signed)
Anesthesia Evaluation  Patient identified by MRN, date of birth, ID band Patient awake    Reviewed: Allergy & Precautions, NPO status , Patient's Chart, lab work & pertinent test results  Airway Mallampati: II  TM Distance: >3 FB Neck ROM: full    Dental  (+) Teeth Intact   Pulmonary neg pulmonary ROS   Pulmonary exam normal        Cardiovascular Exercise Tolerance: Good negative cardio ROS Normal cardiovascular exam Rhythm:Regular Rate:Normal     Neuro/Psych negative neurological ROS  negative psych ROS   GI/Hepatic negative GI ROS, Neg liver ROS,,,  Endo/Other  negative endocrine ROS    Renal/GU negative Renal ROS  negative genitourinary   Musculoskeletal   Abdominal Normal abdominal exam  (+)   Peds negative pediatric ROS (+)  Hematology negative hematology ROS (+)   Anesthesia Other Findings Past Medical History: Penicillin: Allergy 2010 and 2012 Leep Surgery: Cancer ([place]) No date: Irregular menses  Past Surgical History: No date: CERVICAL BIOPSY  W/ LOOP ELECTRODE EXCISION No date: CESAREAN SECTION 2008: LEEP 2010: LEEP     Reproductive/Obstetrics negative OB ROS                             Anesthesia Physical Anesthesia Plan  ASA: 2  Anesthesia Plan: General   Post-op Pain Management:    Induction: Intravenous  PONV Risk Score and Plan: Propofol infusion and TIVA  Airway Management Planned: Natural Airway  Additional Equipment:   Intra-op Plan:   Post-operative Plan:   Informed Consent: I have reviewed the patients History and Physical, chart, labs and discussed the procedure including the risks, benefits and alternatives for the proposed anesthesia with the patient or authorized representative who has indicated his/her understanding and acceptance.     Dental Advisory Given  Plan Discussed with: CRNA and Surgeon  Anesthesia Plan Comments:         Anesthesia Quick Evaluation

## 2022-11-15 NOTE — Anesthesia Postprocedure Evaluation (Signed)
Anesthesia Post Note  Patient: Monica Gallegos  Procedure(s) Performed: COLONOSCOPY WITH PROPOFOL  Patient location during evaluation: PACU Anesthesia Type: General Level of consciousness: awake Pain management: pain level controlled Vital Signs Assessment: post-procedure vital signs reviewed and stable Respiratory status: spontaneous breathing and nonlabored ventilation Cardiovascular status: stable Anesthetic complications: no   No notable events documented.   Last Vitals:  Vitals:   11/15/22 0933 11/15/22 1018  BP: (!) 127/92 (!) 83/59  Pulse: 76 68  Resp: 18 13  Temp: (!) 36.1 C 36.4 C  SpO2: 99% 99%    Last Pain:  Vitals:   11/15/22 1028  TempSrc:   PainSc: 0-No pain                 VAN STAVEREN,Raima Geathers

## 2022-11-15 NOTE — H&P (Signed)
Cephas Darby, MD [address]  [place]  [place], [place] [address]  Main: [phone]  Fax: [phone] Pager: [phone]  Primary Care Physician:  [name], PA-C Primary Gastroenterologist:  Dr. Cephas Darby  Pre-Procedure History & Physical: HPI:  Monica Gallegos is a 47 y.o. female is here for an colonoscopy.   Past Medical History:  Diagnosis Date   Allergy Penicillin   Cancer ([place]) 2010 and 2012 Leep Surgery   Irregular menses     Past Surgical History:  Procedure Laterality Date   CERVICAL BIOPSY  W/ LOOP ELECTRODE EXCISION     CESAREAN SECTION     LEEP  2008   LEEP  2010    Prior to Admission medications   Medication Sig Start Date End Date Taking? Authorizing Provider  ibuprofen (ADVIL) 800 MG tablet Take 800 mg by mouth 4 (four) times daily as needed. 02/22/22   [provider]    Allergies as of 10/15/2022 - Review Complete 10/15/2022  Allergen Reaction Noted   Acetaminophen Hives 05/12/2020   Penicillins Hives 05/12/2020    Family History  Problem Relation Age of Onset   Breast cancer Mother 45   Hypertension Mother    Arthritis Mother    Cancer Mother    Diabetes Father    Hypertension Father    Breast cancer Sister 24   Breast cancer Maternal Aunt 55   Cancer Maternal Grandfather    Cancer Sister    Cancer Maternal Aunt    Ovarian cancer Neg Hx    Cancer - Colon Neg Hx     Social History   Socioeconomic History   Marital status: Single    Spouse name: Not on file   Number of children: Not on file   Years of education: Not on file   Highest education level: Not on file  Occupational History   Not on file  Tobacco Use   Smoking status: Never   Smokeless tobacco: Never   Tobacco comments:    Never  smoke  Vaping Use   Vaping Use: Never used  Substance and Sexual Activity   Alcohol use: Yes    Alcohol/week: 2.0 standard drinks of alcohol    Types: 1 Glasses of wine, 1 Cans of beer per week   Drug use:  Never   Sexual activity: Yes    Birth control/protection: None  Other Topics Concern   Not on file  Social History Narrative   Not on file   Social Determinants of Health   Financial Resource Strain: Not on file  Food Insecurity: Not on file  Transportation Needs: Not on file  Physical Activity: Not on file  Stress: Not on file  Social Connections: Not on file  Intimate Partner Violence: Not on file    Review of Systems: See HPI, otherwise negative ROS  Physical Exam: BP (!) 127/92   Pulse 76   Temp (!) 96.9 F (36.1 C) (Temporal)   Resp 18   Ht 5' 6"$  (1.676 m)   Wt 65.9 kg   LMP  (LMP Unknown)   SpO2 99%   BMI 23.45 kg/m  General:   Alert,  pleasant and cooperative in NAD Head:  Normocephalic and atraumatic. Neck:  Supple; no masses or thyromegaly. Lungs:  Clear throughout to auscultation.    Heart:  Regular rate and rhythm. Abdomen:  Soft, nontender and nondistended. Normal bowel sounds, without guarding, and without rebound.   Neurologic:  Alert and  oriented x4;  grossly normal neurologically.  Impression/Plan: Monica Gallegos is here for an colonoscopy to be performed for colon cancer screening  Risks, benefits, limitations, and alternatives regarding  colonoscopy have been reviewed with the patient.  Questions have been answered.  All parties agreeable.   Sherri Sear, MD  11/15/2022, 9:43 AM

## 2022-11-15 NOTE — Transfer of Care (Signed)
Immediate Anesthesia Transfer of Care Note  Patient: Monica Gallegos  Procedure(s) Performed: COLONOSCOPY WITH PROPOFOL  Patient Location: PACU and Endoscopy Unit  Anesthesia Type:General  Level of Consciousness: drowsy and patient cooperative  Airway & Oxygen Therapy: Patient Spontanous Breathing  Post-op Assessment: Report given to RN and Post -op Vital signs reviewed and stable  Post vital signs: Reviewed and stable  Last Vitals:  Vitals Value Taken Time  BP 83/59 11/15/22 1019  Temp 36.4 C 11/15/22 1018  Pulse 68 11/15/22 1021  Resp 16 11/15/22 1021  SpO2 99 % 11/15/22 1021  Vitals shown include unvalidated device data.  Last Pain:  Vitals:   11/15/22 1018  TempSrc: Temporal  PainSc: Asleep         Complications: No notable events documented.

## 2022-11-18 ENCOUNTER — Encounter: Payer: Self-pay | Admitting: Gastroenterology

## 2022-11-18 LAB — SURGICAL PATHOLOGY

## 2022-12-19 ENCOUNTER — Inpatient Hospital Stay: Payer: Managed Care, Other (non HMO)

## 2022-12-19 ENCOUNTER — Inpatient Hospital Stay: Payer: Managed Care, Other (non HMO) | Attending: Genetic Counselor | Admitting: Genetic Counselor

## 2023-04-22 ENCOUNTER — Ambulatory Visit: Payer: Managed Care, Other (non HMO) | Admitting: Physician Assistant

## 2023-04-22 VITALS — BP 177/99 | HR 78 | Temp 98.2°F | Ht 66.0 in | Wt 148.0 lb

## 2023-04-22 DIAGNOSIS — I10 Essential (primary) hypertension: Secondary | ICD-10-CM | POA: Diagnosis not present

## 2023-04-22 DIAGNOSIS — G4489 Other headache syndrome: Secondary | ICD-10-CM

## 2023-04-22 DIAGNOSIS — R002 Palpitations: Secondary | ICD-10-CM | POA: Diagnosis not present

## 2023-04-22 DIAGNOSIS — F419 Anxiety disorder, unspecified: Secondary | ICD-10-CM

## 2023-04-22 DIAGNOSIS — Z7689 Persons encountering health services in other specified circumstances: Secondary | ICD-10-CM

## 2023-04-22 MED ORDER — CANDESARTAN CILEXETIL 4 MG PO TABS: 4.0000 mg | ORAL_TABLET | Freq: Every day | ORAL | 0 refills | Status: DC
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
## 2023-04-23 ENCOUNTER — Encounter: Payer: Self-pay | Admitting: Physician Assistant

## 2023-04-23 ENCOUNTER — Telehealth: Payer: Self-pay | Admitting: Physician Assistant

## 2023-04-23 DIAGNOSIS — I10 Essential (primary) hypertension: Secondary | ICD-10-CM

## 2023-04-23 MED ORDER — CANDESARTAN CILEXETIL 4 MG PO TABS: 4.0000 mg | ORAL_TABLET | Freq: Every day | ORAL | 0 refills | Status: AC

## 2023-04-23 NOTE — Telephone Encounter (Signed)
Medication Refill - Medication: candesartan (ATACAND) 4 MG tablet   Pt stated she needs Rx for medication candesartan (ATACAND) 4 MG tablet to be sent in for three months for her insurance to cover.  Was unable to pick up medication.  Please advise.   Has the patient contacted their pharmacy? Yes.    (Agent: If yes, when and what did the pharmacy advise?)  Preferred Pharmacy (with phone number or street name):  [id number] [place], [place] - [address] AT [facility] [address]  [address] [place] [address]  Phone: [phone] Fax: [phone]  Hours: Not open 24 hours   Has the patient been seen for an appointment in the last year OR does the patient have an upcoming appointment? Yes.    Agent: Please be advised that RX refills may take up to 3 business days. We ask that you follow-up with your pharmacy.

## 2023-05-01 ENCOUNTER — Encounter: Payer: Managed Care, Other (non HMO) | Admitting: Family Medicine

## 2023-05-02 ENCOUNTER — Encounter: Payer: Managed Care, Other (non HMO) | Admitting: Physician Assistant

## 2023-05-07 ENCOUNTER — Ambulatory Visit: Payer: Managed Care, Other (non HMO) | Admitting: Physician Assistant

## 2023-05-12 ENCOUNTER — Ambulatory Visit: Payer: Managed Care, Other (non HMO) | Admitting: Physician Assistant

## 2023-05-20 ENCOUNTER — Ambulatory Visit: Payer: Managed Care, Other (non HMO) | Admitting: Physician Assistant

## 2023-06-01 NOTE — Progress Notes (Deleted)
Established patient visit  Patient: Monica Gallegos   DOB: [birth year]   47 y.o. Female  MRN: [id number] Visit Date: 06/06/2023  Today's healthcare provider: Debera Lat, PA-C   No chief complaint on file.  Subjective    HPI  *** Discussed the use of AI scribe software for clinical note transcription with the patient, who gave verbal consent to proceed.  History of Present Illness               04/22/2023    4:18 PM 10/18/2022    3:41 PM 07/24/2022    3:55 PM  Depression screen PHQ 2/9  Decreased Interest 0 0 0  Down, Depressed, Hopeless 0 0 1  PHQ - 2 Score 0 0 1  Altered sleeping 3 1 2   Tired, decreased energy 2 2 3   Change in appetite 0 0 0  Feeling bad or failure about yourself  0 0 0  Trouble concentrating 0 0 0  Moving slowly or fidgety/restless 0 0 1  Suicidal thoughts 0 0 0  PHQ-9 Score 5 3 7   Difficult doing work/chores Not difficult at all  Somewhat difficult      04/22/2023    4:39 PM  GAD 7 : Generalized Anxiety Score  Nervous, Anxious, on Edge 2  Control/stop worrying 2  Worry too much - different things 2  Trouble relaxing 2  Restless 2  Easily annoyed or irritable 1  Afraid - awful might happen 1  Total GAD 7 Score 12    Medications: Outpatient Medications Prior to Visit  Medication Sig   candesartan (ATACAND) 4 MG tablet Take 1 tablet (4 mg total) by mouth daily.   No facility-administered medications prior to visit.    Review of Systems  All other systems reviewed and are negative.  Except see HPI   {Insert previous labs (optional):23779} {See past labs  Heme  Chem  Endocrine  Serology  Results Review (optional):1}   Objective    LMP  (LMP Unknown)  {Insert last BP/Wt (optional):23777}{See vitals history (optional):1}   Physical Exam Vitals reviewed.  Constitutional:      General: She is not in acute distress.    Appearance: Normal appearance. She is well-developed. She is not diaphoretic.  HENT:     Head: Normocephalic  and atraumatic.  Eyes:     General: No scleral icterus.    Conjunctiva/sclera: Conjunctivae normal.  Neck:     Thyroid: No thyromegaly.  Cardiovascular:     Rate and Rhythm: Normal rate and regular rhythm.     Pulses: Normal pulses.     Heart sounds: Normal heart sounds. No murmur heard. Pulmonary:     Effort: Pulmonary effort is normal. No respiratory distress.     Breath sounds: Normal breath sounds. No wheezing, rhonchi or rales.  Musculoskeletal:     Cervical back: Neck supple.     Right lower leg: No edema.     Left lower leg: No edema.  Lymphadenopathy:     Cervical: No cervical adenopathy.  Skin:    General: Skin is warm and dry.     Findings: No rash.  Neurological:     Mental Status: She is alert and oriented to person, place, and time. Mental status is at baseline.  Psychiatric:        Mood and Affect: Mood normal.        Behavior: Behavior normal.      No results found for any visits on 06/06/23.  Assessment & Plan  No follow-ups on file.     The patient was advised to call back or seek an in-person evaluation if the symptoms worsen or if the condition fails to improve as anticipated.  I discussed the assessment and treatment plan with the patient. The patient was provided an opportunity to ask questions and all were answered. The patient agreed with the plan and demonstrated an understanding of the instructions.  I, Debera Lat, PA-C have reviewed all documentation for this visit. The documentation on 06/06/23 for the exam, diagnosis, procedures, and orders are all accurate and complete.  Debera Lat, [facility], MMS [facility] [phone] (phone) [phone] (fax)  [facility]

## 2023-06-06 ENCOUNTER — Ambulatory Visit: Payer: Managed Care, Other (non HMO) | Admitting: Physician Assistant

## 2023-06-06 DIAGNOSIS — R140 Abdominal distension (gaseous): Secondary | ICD-10-CM

## 2023-06-06 DIAGNOSIS — I10 Essential (primary) hypertension: Secondary | ICD-10-CM

## 2023-06-06 DIAGNOSIS — R002 Palpitations: Secondary | ICD-10-CM

## 2023-06-06 DIAGNOSIS — F419 Anxiety disorder, unspecified: Secondary | ICD-10-CM

## 2023-06-06 DIAGNOSIS — G4489 Other headache syndrome: Secondary | ICD-10-CM

## 2023-07-04 ENCOUNTER — Ambulatory Visit: Payer: Managed Care, Other (non HMO) | Admitting: Certified Nurse Midwife

## 2023-07-30 ENCOUNTER — Encounter: Payer: Self-pay | Admitting: Certified Nurse Midwife

## 2023-07-30 ENCOUNTER — Ambulatory Visit (INDEPENDENT_AMBULATORY_CARE_PROVIDER_SITE_OTHER): Payer: Managed Care, Other (non HMO) | Admitting: Certified Nurse Midwife

## 2023-07-30 VITALS — BP 120/84 | HR 75 | Ht 66.0 in | Wt 148.3 lb

## 2023-07-30 DIAGNOSIS — Z01419 Encounter for gynecological examination (general) (routine) without abnormal findings: Secondary | ICD-10-CM | POA: Diagnosis not present

## 2023-07-30 DIAGNOSIS — Z23 Encounter for immunization: Secondary | ICD-10-CM | POA: Diagnosis not present

## 2023-07-30 DIAGNOSIS — Z1283 Encounter for screening for malignant neoplasm of skin: Secondary | ICD-10-CM

## 2023-07-30 DIAGNOSIS — Z13220 Encounter for screening for lipoid disorders: Secondary | ICD-10-CM

## 2023-07-30 DIAGNOSIS — Z1231 Encounter for screening mammogram for malignant neoplasm of breast: Secondary | ICD-10-CM

## 2023-07-30 DIAGNOSIS — Z131 Encounter for screening for diabetes mellitus: Secondary | ICD-10-CM

## 2023-07-30 DIAGNOSIS — Z124 Encounter for screening for malignant neoplasm of cervix: Secondary | ICD-10-CM

## 2023-07-30 NOTE — Addendum Note (Signed)
Addended by: Fonda Kinder on: 07/30/2023 01:59 PM   Modules accepted: Orders

## 2023-07-30 NOTE — Progress Notes (Addendum)
GYNECOLOGY ANNUAL PREVENTATIVE CARE ENCOUNTER NOTE  History:     Monica Gallegos is a 47 y.o. G39P1001 female here for a routine annual gynecologic exam.  Current complaints: none.   Denies abnormal vaginal bleeding, discharge, pelvic pain, problems with intercourse or other gynecologic concerns.     Social Relationship:boyfriend  Living: with 36 yr old son Work: Diplomatic Services operational officer Exercise: none  Smoke/Alcohol/drug use: alcohol few x week, denies smoking, vaping , & drug use.   Gynecologic History No LMP recorded (lmp unknown). Patient is postmenopausal. Contraception: vasectomy Last Pap: 06/18/2021. 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 with negative HPV Last mammogram: 07/29/2022. 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, BCRA gene positive Colonoscopy: 11/15/2022   Obstetric History OB History  Gravida Para Term Preterm AB Living  1 1 1     1   SAB IAB Ectopic Multiple Live Births          1    # Outcome Date GA Lbr Len/2nd Weight Sex Type Anes PTL Lv  1 Term 10/15/13   9 lb 6 oz (4.252 kg) M CS-Unspec EPI N LIV    Past Medical History:  Diagnosis Date   Allergy Penicillin   Cancer (HCC) 2010 and 2012 Leep Surgery   Irregular menses     Past Surgical History:  Procedure Laterality Date   CERVICAL BIOPSY  W/ LOOP ELECTRODE EXCISION     CESAREAN SECTION     COLONOSCOPY WITH PROPOFOL N/A 11/15/2022   Procedure: COLONOSCOPY WITH PROPOFOL;  Surgeon: Toney Reil, MD;  Location: [facility] ENDOSCOPY;  Service: Gastroenterology;  Laterality: N/A;   LEEP  2008   LEEP  2010    Current Outpatient Medications on File Prior to Visit  Medication Sig Dispense Refill   candesartan (ATACAND) 4 MG tablet Take 1 tablet (4 mg total) by mouth daily. 90 tablet 0   No current facility-administered medications on file prior to visit.    Allergies  Allergen Reactions   Acetaminophen Hives   Penicillins Hives    Social History:  reports that she has never smoked. She has never used smokeless tobacco. She reports  current alcohol use of about 2.0 standard drinks of alcohol per week. She reports that she does not use drugs.  Family History  Problem Relation Age of Onset   Breast cancer Mother 36   Hypertension Mother    Arthritis Mother    Cancer Mother    Diabetes Father    Hypertension Father    Breast cancer Sister 41   Breast cancer Maternal Aunt 32   Cancer Maternal Grandfather    Cancer Sister    Cancer Maternal Aunt    Ovarian cancer Neg Hx    Cancer - Colon Neg Hx     The following portions of the patient's history were reviewed and updated as appropriate: allergies, current medications, past family history, past medical history, past social history, past surgical history and problem list.  Review of Systems Pertinent items noted in HPI and remainder of comprehensive ROS otherwise negative.  Physical Exam:  BP 120/84   Pulse 75   Ht 5\' 6"  (1.676 m)   Wt 148 lb 4.8 oz (67.3 kg)   LMP  (LMP Unknown)   BMI 23.94 kg/m  CONSTITUTIONAL: Well-developed, well-nourished female in no acute distress.  HENT:  Normocephalic, atraumatic, External right and left ear normal. Oropharynx is clear and moist EYES: Conjunctivae and EOM are normal. Pupils are equal, round, and reactive to light. No scleral icterus.  NECK: Normal range of motion, supple, no masses.  Normal thyroid.  SKIN: Skin is warm and dry. No rash noted. Not diaphoretic. No erythema. No pallor. Pt has suspicious mole on her right breast , dark in color irregular borders that has changed over the past several months MUSCULOSKELETAL: Normal range of motion. No tenderness.  No cyanosis, clubbing, or edema.  2+ distal pulses. NEUROLOGIC: Alert and oriented to person, place, and time. Normal reflexes, muscle tone coordination.  PSYCHIATRIC: Normal mood and affect. Normal behavior. Normal judgment and thought content. CARDIOVASCULAR: Normal heart rate noted, regular rhythm RESPIRATORY: Clear to auscultation bilaterally. Effort and  breath sounds normal, no problems with respiration noted. BREASTS: Symmetric in size. No masses, tenderness, skin changes, nipple drainage, or lymphadenopathy bilaterally.  ABDOMEN: Soft, no distention noted.  No tenderness, rebound or guarding.  PELVIC: Normal appearing external genitalia and urethral meatus; normal appearing vaginal mucosa and cervix.  No abnormal discharge noted.  Pap smear obtained not due, pt requested to have done.  Normal uterine size, no other palpable masses, no uterine or adnexal tenderness.  .   Assessment and Plan:    1. Women's annual routine gynecological examination  pap: ordered  Mammogram : ordered & MRI ( BCRA+) Labs: Hemoglobin A 1 c, Lipid profile Refills: none  Referral: dermatology , evaluation of mole on right breast Routine preventative health maintenance measures emphasized. Please refer to After Visit Summary for other counseling recommendations.      Doreene Burke, CNM [facility]  [facility],  [facility]

## 2023-07-31 LAB — LIPID PANEL
Chol/HDL Ratio: 2.5 ratio (ref 0.0–4.4)
Cholesterol, Total: 172 mg/dL (ref 100–199)
HDL: 68 mg/dL (ref >39)
LDL Chol Calc (NIH): 85 mg/dL (ref 0–99)
Triglycerides: 105 mg/dL (ref 0–149)
VLDL Cholesterol Cal: 19 mg/dL (ref 5–40)

## 2023-07-31 LAB — HEMOGLOBIN A1C
Est. average glucose Bld gHb Est-mCnc: 123 mg/dL
Hgb A1c MFr Bld: 5.9 % — ABNORMAL HIGH (ref 4.8–5.6)

## 2023-08-01 ENCOUNTER — Encounter: Payer: Self-pay | Admitting: Certified Nurse Midwife

## 2023-08-04 ENCOUNTER — Encounter: Payer: Self-pay | Admitting: Certified Nurse Midwife

## 2023-08-04 LAB — IGP,CTNG,APTIMAHPV
Chlamydia, Nuc. Acid Amp: NEGATIVE
Gonococcus by Nucleic Acid Amp: NEGATIVE
HPV Aptima: POSITIVE — AB

## 2023-08-05 ENCOUNTER — Other Ambulatory Visit: Payer: Self-pay | Admitting: Certified Nurse Midwife

## 2023-08-05 DIAGNOSIS — Z862 Personal history of diseases of the blood and blood-forming organs and certain disorders involving the immune mechanism: Secondary | ICD-10-CM

## 2023-08-13 ENCOUNTER — Other Ambulatory Visit: Payer: Self-pay

## 2023-08-13 DIAGNOSIS — Z862 Personal history of diseases of the blood and blood-forming organs and certain disorders involving the immune mechanism: Secondary | ICD-10-CM

## 2023-08-21 ENCOUNTER — Ambulatory Visit: Payer: Managed Care, Other (non HMO) | Admitting: Obstetrics and Gynecology

## 2023-08-21 ENCOUNTER — Other Ambulatory Visit: Payer: Self-pay | Admitting: Certified Nurse Midwife

## 2023-08-21 VITALS — BP 129/93 | HR 78 | Ht 66.0 in | Wt 149.0 lb

## 2023-08-21 DIAGNOSIS — Z87410 Personal history of cervical dysplasia: Secondary | ICD-10-CM

## 2023-08-21 DIAGNOSIS — Z9889 Other specified postprocedural states: Secondary | ICD-10-CM

## 2023-08-21 DIAGNOSIS — Z1501 Genetic susceptibility to malignant neoplasm of breast: Secondary | ICD-10-CM | POA: Diagnosis not present

## 2023-08-21 DIAGNOSIS — Z803 Family history of malignant neoplasm of breast: Secondary | ICD-10-CM

## 2023-08-21 DIAGNOSIS — R8789 Other abnormal findings in specimens from female genital organs: Secondary | ICD-10-CM

## 2023-08-21 DIAGNOSIS — Z862 Personal history of diseases of the blood and blood-forming organs and certain disorders involving the immune mechanism: Secondary | ICD-10-CM

## 2023-08-21 NOTE — Progress Notes (Signed)
GYNECOLOGY PROGRESS NOTE  Subjective:    Patient ID: Monica Gallegos, female    DOB: 1975-11-04, 47 y.o.   MRN: [id number]  HPI  Patient is a 47 y.o. G1P1001 postmenopausal female who presents for colposcopy for NILM, HPV+ (no typing) pap smear performed 07/30/2023 .  She was referred by Doreene Burke, CNM.  Patient denies complaints today.  She has a remote history of abnormal pap smears, requiring LEEP in 2008 and repeat LEEP in 2010.  She has had no other issues with her pap smears since that time.  Reports getting pap smears every 6 months to a year up until 2021 when she relocated from [place] to [place].  Patient also reports that she has strong family history of cancer (mostly breast) and that she herself is positive for the BRCA gene.     OB History  Gravida Para Term Preterm AB Living  1 1 1     1   SAB IAB Ectopic Multiple Live Births          1    # Outcome Date GA Lbr Len/2nd Weight Sex Type Anes PTL Lv  1 Term 10/15/13   9 lb 6 oz (4.252 kg) M CS-Unspec EPI N LIV     The following portions of the patient's history were reviewed and updated as appropriate:  OB History  Gravida Para Term Preterm AB Living  1 1 1     1   SAB IAB Ectopic Multiple Live Births          1    # Outcome Date GA Lbr Len/2nd Weight Sex Type Anes PTL Lv  1 Term 10/15/13   9 lb 6 oz (4.252 kg) M CS-Unspec EPI N LIV    Past Medical History:  Diagnosis Date   Allergy Penicillin   Cancer (HCC) 2010 and 2012 Leep Surgery   Irregular menses     Family History  Problem Relation Age of Onset   Breast cancer Mother 46   Hypertension Mother    Arthritis Mother    Cancer Mother    Diabetes Father    Hypertension Father    Breast cancer Sister 39   Breast cancer Maternal Aunt 50   Cancer Maternal Grandfather    Cancer Sister    Cancer Maternal Aunt    Ovarian cancer Neg Hx    Cancer - Colon Neg Hx     Past Surgical History:  Procedure Laterality Date   CERVICAL BIOPSY  W/ LOOP  ELECTRODE EXCISION     CESAREAN SECTION     COLONOSCOPY WITH PROPOFOL N/A 11/15/2022   Procedure: COLONOSCOPY WITH PROPOFOL;  Surgeon: Toney Reil, MD;  Location: [facility] END[facility]COPY;  Service: Gastroenterology;  Laterality: N/A;   LEEP  2008   LEEP  2010    Social History   Tobacco Use   Smoking status: Never   Smokeless tobacco: Never   Tobacco comments:    Never  smoke  Vaping Use   Vaping status: Never Used  Substance Use Topics   Alcohol use: Yes    Alcohol/week: 2.0 standard drinks of alcohol    Types: 1 Glasses of wine, 1 Cans of beer per week   Drug use: Never    Current Outpatient Medications on File Prior to Visit  Medication Sig Dispense Refill   candesartan (ATACAND) 4 MG tablet Take 1 tablet (4 mg total) by mouth daily. 90 tablet 0   No current facility-administered medications on file prior to  visit.    Allergies  Allergen Reactions   Acetaminophen Hives   Penicillins Hives    Review of Systems A comprehensive review of systems was negative.   Objective:   Blood pressure (!) 129/93, pulse 78, height 5\' 6"  (1.676 m), weight 149 lb (67.6 kg).  Body mass index is 24.05 kg/m. General appearance: alert and no distress Remainder of exam deferred   Assessment:   1. 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 History of cervical dysplasia   3. History of loop electrical excision procedure (LEEP)   4. BRCA gene mutation test positive   5. Family history of breast cancer   6. History of thrombocytopenia      Plan:   Abnormal pap smear - Lengthy discussion had with patient regarding pap smear results. Based on current pap test having normal cytology and HR HPV+ (but without typing), based on ASCCP guidelines, patient can f/u in 1 year for repeat pap smear. Discussed that her risk of progression to CIN 3 in 5 years was only 4.8%. Follow up within 1 year will also allow her body a chance to clear the HPV infection. Review of chart notes  that her last several pap smears have been normal up to this time. Patient notes understanding, but states she would feel better about a 6 month interval as that is what she was used to in [place]. Ok to repeat pap in 6 months, but would need HPV typing performed at that pap smear.    Family history of breast cancer with personal history of positive BRCA gene - patient notes that her insurance is requesting for more information for recent breast MRI request. Advised that I would inform her provider or requested documentation.   History of thrombocytopenia - reports that her provider requested for her to have a repeat CBC drawn due to low platelets noted last year on her labs.Can have drawn today.   A total of 27 minutes were spent during this encounter, including review of previous progress notes, recent imaging and labs, face-to-face with time with patient involving counseling and coordination of care, as well as documentation for current visit.    Hildred Laser, MD [facility]

## 2023-08-22 LAB — CBC WITH DIFFERENTIAL/PLATELET
Basophils Absolute: 0 10*3/uL (ref 0.0–0.2)
Basos: 1 %
EOS (ABSOLUTE): 0.2 10*3/uL (ref 0.0–0.4)
Eos: 3 %
Hematocrit: 39.7 % (ref 34.0–46.6)
Hemoglobin: 12.7 g/dL (ref 11.1–15.9)
Immature Grans (Abs): 0 10*3/uL (ref 0.0–0.1)
Immature Granulocytes: 0 %
Lymphocytes Absolute: 1.9 10*3/uL (ref 0.7–3.1)
Lymphs: 34 %
MCH: 29.8 pg (ref 26.6–33.0)
MCHC: 32 g/dL (ref 31.5–35.7)
MCV: 93 fL (ref 79–97)
Monocytes Absolute: 0.5 10*3/uL (ref 0.1–0.9)
Monocytes: 9 %
Neutrophils Absolute: 3.1 10*3/uL (ref 1.4–7.0)
Neutrophils: 53 %
Platelets: 184 10*3/uL (ref 150–450)
RBC: 4.26 x10E6/uL (ref 3.77–5.28)
RDW: 13.2 % (ref 11.7–15.4)
WBC: 5.7 10*3/uL (ref 3.4–10.8)

## 2023-09-15 ENCOUNTER — Ambulatory Visit
Admission: RE | Admit: 2023-09-15 | Discharge: 2023-09-15 | Disposition: A | Discharge status: Home / Self Care | Payer: Managed Care, Other (non HMO) | Source: Ambulatory Visit | Attending: Certified Nurse Midwife | Admitting: Certified Nurse Midwife

## 2023-09-15 DIAGNOSIS — R92333 Mammographic heterogeneous density, bilateral breasts: Secondary | ICD-10-CM | POA: Diagnosis not present

## 2023-09-15 DIAGNOSIS — Z01419 Encounter for gynecological examination (general) (routine) without abnormal findings: Secondary | ICD-10-CM

## 2023-09-15 DIAGNOSIS — Z1231 Encounter for screening mammogram for malignant neoplasm of breast: Secondary | ICD-10-CM | POA: Diagnosis present

## 2023-09-17 ENCOUNTER — Other Ambulatory Visit: Payer: Self-pay | Admitting: Certified Nurse Midwife

## 2023-09-17 DIAGNOSIS — R928 Other abnormal and inconclusive findings on diagnostic imaging of breast: Secondary | ICD-10-CM

## 2023-09-20 ENCOUNTER — Other Ambulatory Visit: Payer: Self-pay | Admitting: Certified Nurse Midwife

## 2023-09-20 ENCOUNTER — Encounter: Payer: Self-pay | Admitting: Certified Nurse Midwife

## 2023-10-13 ENCOUNTER — Ambulatory Visit: Payer: Managed Care, Other (non HMO) | Admitting: Physician Assistant

## 2023-10-17 LAB — HM MAMMOGRAPHY

## 2023-10-23 ENCOUNTER — Encounter: Payer: Self-pay | Admitting: Physician Assistant

## 2023-10-24 ENCOUNTER — Ambulatory Visit (INDEPENDENT_AMBULATORY_CARE_PROVIDER_SITE_OTHER): Payer: Managed Care, Other (non HMO) | Admitting: Certified Nurse Midwife

## 2023-10-24 ENCOUNTER — Encounter: Payer: Self-pay | Admitting: Certified Nurse Midwife

## 2023-10-24 ENCOUNTER — Other Ambulatory Visit (HOSPITAL_COMMUNITY)
Admission: RE | Admit: 2023-10-24 | Discharge: 2023-10-24 | Disposition: A | Discharge status: Home / Self Care | Payer: Managed Care, Other (non HMO) | Source: Ambulatory Visit | Attending: Certified Nurse Midwife | Admitting: Certified Nurse Midwife

## 2023-10-24 VITALS — BP 130/87 | HR 71 | Ht 66.0 in | Wt 149.1 lb

## 2023-10-24 DIAGNOSIS — R102 Pelvic and perineal pain: Secondary | ICD-10-CM

## 2023-10-24 LAB — POCT URINALYSIS DIPSTICK
Bilirubin, UA: NEGATIVE
Blood, UA: NEGATIVE
Glucose, UA: NEGATIVE
Ketones, UA: NEGATIVE
Leukocytes, UA: NEGATIVE
Nitrite, UA: NEGATIVE
Protein, UA: NEGATIVE
Spec Grav, UA: 1.01 (ref 1.010–1.025)
Urobilinogen, UA: 0.2 U/dL
pH, UA: 7.5 (ref 5.0–8.0)

## 2023-10-24 MED ORDER — FLUCONAZOLE 150 MG PO TABS: 150.0000 mg | ORAL_TABLET | Freq: Once | ORAL | 0 refills | Status: AC

## 2023-10-24 NOTE — Addendum Note (Signed)
Addended by: Sheliah Hatch on: 10/24/2023 03:37 PM   Modules accepted: Orders

## 2023-10-24 NOTE — Addendum Note (Signed)
Addended by: Sheliah Hatch on: 10/24/2023 03:40 PM   Modules accepted: Orders

## 2023-10-24 NOTE — Progress Notes (Signed)
GYN ENCOUNTER NOTE  Subjective:       Monica Gallegos is a 48 y.o. G74P1001 female is here for gynecologic evaluation of the following issues:  1. Vaginal pain .  Pt state she noticed it several months ago during intercourse it was a pinching sensation, it then became more of a burning sensation and now she noticed an odor.    Gynecologic History No LMP recorded (lmp unknown). Patient is postmenopausal. Contraception: vasectomy Last Pap:07/30/2023. Results were: NILM/+HPV  Last mammogram:09/17/2023 .  Obstetric History OB History  Gravida Para Term Preterm AB Living  1 1 1   1   SAB IAB Ectopic Multiple Live Births      1    # Outcome Date GA Lbr Len/2nd Weight Sex Type Anes PTL Lv  1 Term 10/15/13   9 lb 6 oz (4.252 kg) M CS-Unspec EPI N LIV    Past Medical History:  Diagnosis Date   Allergy Penicillin   Cancer (HCC) 2010 and 2012 Leep Surgery   Irregular menses     Past Surgical History:  Procedure Laterality Date   CERVICAL BIOPSY  W/ LOOP ELECTRODE EXCISION     CESAREAN SECTION     COLONOSCOPY WITH PROPOFOL N/A 11/15/2022   Procedure: COLONOSCOPY WITH PROPOFOL;  Surgeon: Toney Reil, MD;  Location: [facility] END[facility]COPY;  Service: Gastroenterology;  Laterality: N/A;   LEEP  2008   LEEP  2010    Current Outpatient Medications on File Prior to Visit  Medication Sig Dispense Refill   candesartan (ATACAND) 4 MG tablet Take 1 tablet (4 mg total) by mouth daily. 90 tablet 0   No current facility-administered medications on file prior to visit.    Allergies  Allergen Reactions   Acetaminophen Hives   Penicillins Hives    Social History   Socioeconomic History   Marital status: Single    Spouse name: Not on file   Number of children: Not on file   Years of education: Not on file   Highest education level: Not on file  Occupational History   Not on file  Tobacco Use   Smoking status: Never   Smokeless tobacco: Never   Tobacco comments:    Never  smoke  Vaping  Use   Vaping status: Never Used  Substance and Sexual Activity   Alcohol use: Yes    Alcohol/week: 2.0 standard drinks of alcohol    Types: 1 Glasses of wine, 1 Cans of beer per week   Drug use: Never   Sexual activity: Yes    Birth control/protection: None  Other Topics Concern   Not on file  Social History Narrative   Not on file   Social Drivers of Health   Financial Resource Strain: Not on file  Food Insecurity: Not on file  Transportation Needs: Not on file  Physical Activity: Not on file  Stress: Not on file  Social Connections: Not on file  Intimate Partner Violence: Not on file    Family History  Problem Relation Age of Onset   Breast cancer Mother 62   Hypertension Mother    Arthritis Mother    Cancer Mother    Diabetes Father    Hypertension Father    Breast cancer Sister 43   Breast cancer Maternal Aunt 50   Cancer Maternal Grandfather    Cancer Sister    Cancer Maternal Aunt    Ovarian cancer Neg Hx    Cancer - Colon Neg Hx     The  following portions of the patient's history were reviewed and updated as appropriate: allergies, current medications, past family history, past medical history, past social history, past surgical history and problem list.  Review of Systems Review of Systems - Negative except as mentioned in HPI Review of Systems - General ROS: negative for - chills, fatigue, fever, hot flashes, malaise or night sweats Hematological and Lymphatic ROS: negative for - bleeding problems or swollen lymph nodes Gastrointestinal ROS: negative for - abdominal pain, blood in stools, change in bowel habits and nausea/vomiting Musculoskeletal ROS: negative for - joint pain, muscle pain or muscular weakness Genito-Urinary ROS: negative for - change in menstrual cycle, dysmenorrhea, dyspareunia, dysuria, genital discharge, genital ulcers, hematuria, incontinence, irregular/heavy menses, nocturia or pelvic pain. Positive for vaginal pain , burning,  and  odor   Objective:   LMP  (LMP Unknown)  CONSTITUTIONAL: Well-developed, well-nourished female in no acute distress.  HENT:  Normocephalic, atraumatic.  NECK: Normal range of motion, supple, no masses.  Normal thyroid.  SKIN: Skin is warm and dry. No rash noted. Not diaphoretic. No erythema. No pallor. NEUROLGIC: Alert and oriented to person, place, and time. PSYCHIATRIC: Normal mood and affect. Normal behavior. Normal judgment and thought content. CARDIOVASCULAR:Not Examined RESPIRATORY: Not Examined BREASTS: Not Examined ABDOMEN: Soft, non distended; Non tender.  No Organomegaly. PELVIC:  External Genitalia: Normal  BUS: Normal  Vagina: Normal, swab collected  Cervix: Normal, tender to touch   Uterus: Normal size, shape,consistency, mobile  Adnexa: Normal  RV: Normal   Bladder: Nontender MUSCULOSKELETAL: Normal range of motion. No tenderness.  No cyanosis, clubbing, or edema.     Assessment:   Vaginal pain Cervical motion tenderness    Plan:   Vaginal swab collected will follow up with results. Pt has history of yeast infection. Order placed for diflucan. Discussed normal vaginal atrophy with menopause and self help measures including vaginal estrogen. She verbalizes understanding. Will follow up with results.   Doreene Burke, CNM

## 2023-10-27 ENCOUNTER — Encounter: Payer: Self-pay | Admitting: Physician Assistant

## 2023-10-27 ENCOUNTER — Ambulatory Visit: Payer: Managed Care, Other (non HMO) | Admitting: Physician Assistant

## 2023-10-27 ENCOUNTER — Encounter: Payer: Self-pay | Admitting: Dermatology

## 2023-10-27 ENCOUNTER — Ambulatory Visit: Payer: Managed Care, Other (non HMO) | Admitting: Dermatology

## 2023-10-27 VITALS — BP 132/105 | HR 75 | Temp 97.8°F | Ht 66.0 in | Wt 144.0 lb

## 2023-10-27 DIAGNOSIS — Z780 Asymptomatic menopausal state: Secondary | ICD-10-CM

## 2023-10-27 DIAGNOSIS — W098XXA Fall on or from other playground equipment, initial encounter: Secondary | ICD-10-CM | POA: Diagnosis not present

## 2023-10-27 DIAGNOSIS — L578 Other skin changes due to chronic exposure to nonionizing radiation: Secondary | ICD-10-CM

## 2023-10-27 DIAGNOSIS — F419 Anxiety disorder, unspecified: Secondary | ICD-10-CM | POA: Diagnosis not present

## 2023-10-27 DIAGNOSIS — L814 Other melanin hyperpigmentation: Secondary | ICD-10-CM

## 2023-10-27 DIAGNOSIS — D1801 Hemangioma of skin and subcutaneous tissue: Secondary | ICD-10-CM

## 2023-10-27 DIAGNOSIS — I10 Essential (primary) hypertension: Secondary | ICD-10-CM

## 2023-10-27 DIAGNOSIS — J328 Other chronic sinusitis: Secondary | ICD-10-CM

## 2023-10-27 DIAGNOSIS — G4489 Other headache syndrome: Secondary | ICD-10-CM

## 2023-10-27 DIAGNOSIS — D229 Melanocytic nevi, unspecified: Secondary | ICD-10-CM

## 2023-10-27 DIAGNOSIS — D2262 Melanocytic nevi of left upper limb, including shoulder: Secondary | ICD-10-CM

## 2023-10-27 DIAGNOSIS — L821 Other seborrheic keratosis: Secondary | ICD-10-CM | POA: Diagnosis not present

## 2023-10-27 DIAGNOSIS — Z808 Family history of malignant neoplasm of other organs or systems: Secondary | ICD-10-CM

## 2023-10-27 DIAGNOSIS — D2271 Melanocytic nevi of right lower limb, including hip: Secondary | ICD-10-CM

## 2023-10-27 DIAGNOSIS — L905 Scar conditions and fibrosis of skin: Secondary | ICD-10-CM

## 2023-10-27 DIAGNOSIS — Z1283 Encounter for screening for malignant neoplasm of skin: Secondary | ICD-10-CM

## 2023-10-27 MED ORDER — BUSPIRONE HCL 5 MG PO TABS: ORAL_TABLET | ORAL | 0 refills | Status: DC

## 2023-10-27 NOTE — Progress Notes (Signed)
   New Patient Visit   Subjective  Monica Gallegos is a 48 y.o. female who presents for the following: Skin Cancer Screening and Full Body Skin Exam. Right breast. Mole that is larger. Has been there for several years. Grandfather has had a skin cancer. Twin sister had skin cancer removed from her leg.   The patient presents for Total-Body Skin Exam (TBSE) for skin cancer screening and mole check. The patient has spots, moles and lesions to be evaluated, some may be new or changing and the patient may have concern these could be cancer.    The following portions of the chart were reviewed this encounter and updated as appropriate: medications, allergies, medical history  Review of Systems:  No other skin or systemic complaints except as noted in HPI or Assessment and Plan.  Objective  Well appearing patient in no apparent distress; mood and affect are within normal limits.  A full examination was performed including scalp, head, eyes, ears, nose, lips, neck, chest, axillae, abdomen, back, buttocks, bilateral upper extremities, bilateral lower extremities, hands, feet, fingers, toes, fingernails, and toenails. All findings within normal limits unless otherwise noted below.   Relevant physical exam findings are noted in the Assessment and Plan.    Assessment & Plan   FAMILY HISTORY OF SKIN CANCER What type(s): Father Who affected: NMSC   SKIN CANCER SCREENING PERFORMED TODAY.  ACTINIC DAMAGE - Chronic condition, secondary to cumulative UV/sun exposure - diffuse scaly erythematous macules with underlying dyspigmentation - Recommend daily broad spectrum sunscreen SPF 30+ to sun-exposed areas, reapply every 2 hours as needed.  - Staying in the shade or wearing long sleeves, sun glasses (UVA+UVB protection) and wide brim hats (4-inch brim around the entire circumference of the hat) are also recommended for sun protection.  - Call for new or changing lesions. - recommend FBSE every 2  years given hx of many sunburns from living in [place]  LENTIGINES, SEBORRHEIC KERATOSES, HEMANGIOMAS - Benign normal skin lesions - Benign-appearing - Call for any changes  MELANOCYTIC NEVI - Tan-brown and/or pink-flesh-colored symmetric macules and papules - Benign appearing on exam today - Observation - Call clinic for new or changing moles - Recommend daily use of broad spectrum spf 30+ sunscreen to sun-exposed areas.   SEBORRHEIC KERATOSIS - Stuck-on, waxy, brown papule at right breast  - Benign-appearing - Discussed benign etiology and prognosis. - Observe - Call for any changes   MELANOCYTIC NEVUS Exam:  brown fleshy papule at left anterior axillary line.  6 mm pigmented pink macule at right lateral plantar foot.   Treatment Plan: Benign appearing on exam today. Recommend observation. Call clinic for new or changing moles. Recommend daily use of broad spectrum spf 30+ sunscreen to sun-exposed areas.    Left upper back scar, R shoulder scar - patient had something removed on L upper back - R shoulder scar from BCG vaccination MULTIPLE BENIGN NEVI   CHERRY ANGIOMA   SEBORRHEIC KERATOSES   ACTINIC ELASTOSIS   LENTIGINES    Return in about 2 years (around 10/26/2025) for TBSE .  I, Lawson Radar, CMA, am acting as scribe for Elie Goody, MD.   Documentation: I have reviewed the above documentation for accuracy and completeness, and I agree with the above.  Elie Goody, MD

## 2023-10-27 NOTE — Progress Notes (Signed)
Established patient visit  Patient: Monica Gallegos   DOB: [birth year]   48 y.o. Female  MRN: [id number] Visit Date: 10/27/2023  Today's healthcare provider: Debera Lat, PA-C   Chief Complaint  Patient presents with   Hypertension    Patient states that since being put on the blood pressure medication in July she has not been taking it daily she would only take it when she felt she needed it.  For the last 2 weeks she states she has not been taking it at all.  Her readings over the last few weeks have 99-120 over 73-88.  She complains of headaches and blurred vision.    Subjective      Discussed the use of AI scribe software for clinical note transcription with the patient, who gave verbal consent to proceed.  History of Present Illness   The patient, a histotechnologist with a history of hypertension, presents with fluctuating blood pressure and persistent headaches. The patient reports that the headaches are squeezing in nature, located in the frontal region, and have been ongoing since last year. The headaches are associated with a feeling of pressure in the ears. The patient also reports feeling low energy and has been tracking her blood pressure, which fluctuates from high to low. The patient has been taking blood pressure medication irregularly, only when she feels her blood pressure is high.  The patient also reports symptoms suggestive of sinusitis, including pressure in the ears and a history of allergies. The patient has been seen by an ENT specialist who diagnosed her with allergies. The patient has been taking over-the-counter allergy medications, but the symptoms persist.  The patient also expresses significant stress and anxiety related to her work and personal life. She is a single mother and feels overwhelmed with her responsibilities. She also reports stress from her work environment, where she needs to focus intensely and handle human tissues. The patient has been trying  relaxation techniques and meditation music to manage her anxiety.           04/22/2023    4:18 PM 10/18/2022    3:41 PM 07/24/2022    3:55 PM  Depression screen PHQ 2/9  Decreased Interest 0 0 0  Down, Depressed, Hopeless 0 0 1  PHQ - 2 Score 0 0 1  Altered sleeping 3 1 2   Tired, decreased energy 2 2 3   Change in appetite 0 0 0  Feeling bad or failure about yourself  0 0 0  Trouble concentrating 0 0 0  Moving slowly or fidgety/restless 0 0 1  Suicidal thoughts 0 0 0  PHQ-9 Score 5 3 7   Difficult doing work/chores Not difficult at all  Somewhat difficult      04/22/2023    4:39 PM  GAD 7 : Generalized Anxiety Score  Nervous, Anxious, on Edge 2  Control/stop worrying 2  Worry too much - different things 2  Trouble relaxing 2  Restless 2  Easily annoyed or irritable 1  Afraid - awful might happen 1  Total GAD 7 Score 12    Medications: Outpatient Medications Prior to Visit  Medication Sig   candesartan (ATACAND) 4 MG tablet Take 1 tablet (4 mg total) by mouth daily.   No facility-administered medications prior to visit.    Review of Systems All negative Except see HPI       Objective    BP (!) 132/105   Pulse 75   Temp 97.8 F (36.6 C) (Oral)   Ht 5'  6" (1.676 m)   Wt 144 lb (65.3 kg)   LMP  (LMP Unknown)   SpO2 100%   BMI 23.24 kg/m     Physical Exam Vitals reviewed.  Constitutional:      General: She is not in acute distress.    Appearance: Normal appearance. She is well-developed. She is not diaphoretic.  HENT:     Head: Normocephalic and atraumatic.     Right Ear: Ear canal and external ear normal.     Left Ear: Ear canal and external ear normal.     Ears:     Comments: Fluids behind the tms    Nose: Congestion and rhinorrhea present.  Eyes:     General: No scleral icterus.       Right eye: No discharge.        Left eye: No discharge.     Extraocular Movements: Extraocular movements intact.     Conjunctiva/sclera: Conjunctivae normal.      Pupils: Pupils are equal, round, and reactive to light.  Neck:     Thyroid: No thyromegaly.  Cardiovascular:     Rate and Rhythm: Normal rate and regular rhythm.     Pulses: Normal pulses.     Heart sounds: Normal heart sounds. No murmur heard. Pulmonary:     Effort: Pulmonary effort is normal. No respiratory distress.     Breath sounds: Normal breath sounds. No wheezing, rhonchi or rales.  Musculoskeletal:     Cervical back: Neck supple.     Right lower leg: No edema.     Left lower leg: No edema.  Lymphadenopathy:     Cervical: No cervical adenopathy.  Skin:    General: Skin is warm and dry.     Findings: No rash.  Neurological:     Mental Status: She is alert and oriented to person, place, and time. Mental status is at baseline.  Psychiatric:        Mood and Affect: Mood normal.        Behavior: Behavior normal.      No results found for any visits on 10/27/23.      Assessment and Plan    Hypertension Inconsistent blood pressure control due to irregular medication use. Patient experiences headaches and palpitations with high blood pressure. -Resume blood pressure medication at 2mg  daily. -Check blood pressure twice daily (morning and evening) and record readings. -Follow-up in two weeks to assess blood pressure control.  Headache Frontal headaches with pressure sensation, possibly related to sinusitis or ergonomics at work. No visual changes or neurological deficits noted. -Continue sinusitis treatment with Flonase, Claritin, and nasal saline. -Consider referral to neurology if headaches persist. -Advise on ergonomic adjustments at work.  Anxiety Patient reports high stress levels, particularly related to work. -Start Buspar at 2.5mg  daily, can increase to 5mg  daily if tolerated and needed. -Consider referral for therapy sessions.  Sinusitis Chronic symptoms of pressure and congestion, possibly contributing to headaches. -Continue treatment with Flonase,  Claritin, and nasal saline.  Menopause No active complaints, but patient reports cessation of menses three years ago. -No specific intervention needed at this time. Consider obgyn referral  Ergonomics at work Patient reports prolonged sitting and fine motor tasks at work, possibly contributing to headaches and back pain. -Advise on ergonomic adjustments at work. -Encourage regular breaks and physical activity. Consider pt referral  General Health Maintenance -Encourage healthy diet, adequate hydration, and regular physical activity. -Advise on use of eye drops for eye strain. -Consider use of Tylenol  or ibuprofen (with stomach protection) for headache management. -Follow-up in two weeks.      Orders Placed This Encounter  Procedures   Ambulatory referral to Psychology    Referral Priority:   Routine    Referral Type:   Psychiatric    Referral Reason:   Specialty Services Required    Requested Specialty:   Psychology    Number of Visits Requested:   1    Return in about 2 weeks (around 11/10/2023) for BP f/u.   The patient was advised to call back or seek an in-person evaluation if the symptoms worsen or if the condition fails to improve as anticipated.  I discussed the assessment and treatment plan with the patient. The patient was provided an opportunity to ask questions and all were answered. The patient agreed with the plan and demonstrated an understanding of the instructions.  I, Debera Lat, PA-C have reviewed all documentation for this visit. The documentation on 10/27/2023  for the exam, diagnosis, procedures, and orders are all accurate and complete.  Debera Lat, [facility], MMS [facility] [phone] (phone) [phone] (fax)  [facility]

## 2023-10-27 NOTE — Patient Instructions (Signed)

## 2023-10-28 ENCOUNTER — Encounter: Payer: Self-pay | Admitting: Certified Nurse Midwife

## 2023-10-28 LAB — CERVICOVAGINAL ANCILLARY ONLY
Bacterial Vaginitis (gardnerella): NEGATIVE
Candida Glabrata: NEGATIVE
Candida Vaginitis: NEGATIVE
Chlamydia: NEGATIVE
Comment: NEGATIVE
Comment: NEGATIVE
Comment: NEGATIVE
Comment: NEGATIVE
Comment: NEGATIVE
Comment: NORMAL
Neisseria Gonorrhea: NEGATIVE
Trichomonas: NEGATIVE

## 2023-11-05 ENCOUNTER — Ambulatory Visit: Payer: Managed Care, Other (non HMO) | Admitting: Dermatology

## 2023-11-11 ENCOUNTER — Ambulatory Visit: Payer: Self-pay | Admitting: Physician Assistant

## 2023-11-20 ENCOUNTER — Other Ambulatory Visit: Payer: Self-pay | Admitting: Physician Assistant

## 2023-11-20 DIAGNOSIS — F419 Anxiety disorder, unspecified: Secondary | ICD-10-CM

## 2023-11-21 NOTE — Telephone Encounter (Signed)
Requested Prescriptions  Pending Prescriptions Disp Refills   busPIRone (BUSPAR) 5 MG tablet [Pharmacy Med Name: BUSPIRONE HCL 5 MG TABLET] 90 tablet 0    Sig: TAKE 1 TABLET BY MOUTH EVERY DAY     Psychiatry: Anxiolytics/Hypnotics - Non-controlled Passed - 11/21/2023 10:05 AM      Passed - Valid encounter within last 12 months    Recent Outpatient Visits           3 weeks ago Anxiety   [place] [place], [place], PA-C   7 months ago Primary hypertension   [facility] [place], [place], [place]   1 year ago Abdominal bloating   [facility] Alfredia Ferguson, PA-C   1 year ago Palpitations   [facility] Alfredia Ferguson, [place]

## 2023-12-02 ENCOUNTER — Telehealth: Payer: Self-pay

## 2023-12-02 NOTE — Telephone Encounter (Signed)
 Patient states she was seen by Pattricia Boss on 10/24/23 and had a swab test. It was sent to [facility] and she has received a bill. She is a Paramedic. Advised will discuss with staff and lab and get back to her.

## 2023-12-05 ENCOUNTER — Other Ambulatory Visit: Payer: Self-pay | Admitting: Physician Assistant

## 2023-12-05 DIAGNOSIS — F419 Anxiety disorder, unspecified: Secondary | ICD-10-CM

## 2023-12-19 ENCOUNTER — Other Ambulatory Visit: Payer: Self-pay | Admitting: Physician Assistant

## 2023-12-19 DIAGNOSIS — F419 Anxiety disorder, unspecified: Secondary | ICD-10-CM

## 2023-12-22 NOTE — Telephone Encounter (Signed)
 Requested medication (s) are due for refill today: No  Requested medication (s) are on the active medication list: Yes  Last refill:  12/05/23  Future visit scheduled:   Notes to clinic:  See pharmacy request.    Requested Prescriptions  Pending Prescriptions Disp Refills   busPIRone (BUSPAR) 5 MG tablet [Pharmacy Med Name: BUSPIRONE HCL 5 MG TABLET] 90 tablet 2    Sig: TAKE 1 TABLET BY MOUTH EVERY DAY     Psychiatry: Anxiolytics/Hypnotics - Non-controlled Passed - 12/22/2023 12:10 PM      Passed - Valid encounter within last 12 months    Recent Outpatient Visits           1 month ago Anxiety   [place] [facility] [place], [place], PA-C   8 months ago Primary hypertension   [facility] [place], [place], [place]   1 year ago Abdominal bloating   [facility] [facility] Alfredia Ferguson, PA-C   1 year ago Palpitations   [facility] Alfredia Ferguson, [place]

## 2024-02-03 ENCOUNTER — Ambulatory Visit: Payer: Managed Care, Other (non HMO) | Admitting: Dermatology

## 2024-04-29 ENCOUNTER — Encounter: Payer: Self-pay | Admitting: Certified Nurse Midwife

## 2024-06-04 ENCOUNTER — Ambulatory Visit: Admitting: Certified Nurse Midwife

## 2024-06-04 VITALS — BP 132/81 | HR 81 | Ht 66.0 in | Wt 145.9 lb

## 2024-06-04 DIAGNOSIS — Z87898 Personal history of other specified conditions: Secondary | ICD-10-CM

## 2024-06-04 DIAGNOSIS — N898 Other specified noninflammatory disorders of vagina: Secondary | ICD-10-CM | POA: Diagnosis not present

## 2024-06-04 DIAGNOSIS — N941 Unspecified dyspareunia: Secondary | ICD-10-CM | POA: Insufficient documentation

## 2024-06-04 MED ORDER — ESTRADIOL 0.1 MG/GM VA CREA: 1.0000 | TOPICAL_CREAM | Freq: Every day | VAGINAL | 12 refills | Status: AC

## 2024-06-04 NOTE — Progress Notes (Signed)
 GYN ENCOUNTER NOTE  Subjective:       Monica Gallegos is a 48 y.o. G26P1001 female is here for gynecologic evaluation of the following issues:  1. Pt state she has had yeast or BV infection that she had treated through her work, she states it got better but then started again so she was treated with over the counter monistat. She feels like she was treating x 4 weeks and state that it just recently got better but she is having pain with intercourse. She would like to have a swab to make sure the infection has cleared 2. She requesting testing for diabetes . She has history of GDM and her Hgb A1c last year was in the prediabetes range. .     Gynecologic History No LMP recorded (lmp unknown). Patient is postmenopausal. Contraception: vasectomy Last Pap: 07/30/2023. Results were: NILM/+HPV  Last mammogram: 09/15/2023. Results were: abnormal  Obstetric History OB History  Gravida Para Term Preterm AB Living  1 1 1   1   SAB IAB Ectopic Multiple Live Births      1    # Outcome Date GA Lbr Len/2nd Weight Sex Type Anes PTL Lv  1 Term 10/15/13   9 lb 6 oz (4.252 kg) M CS-Unspec EPI N LIV    Past Medical History:  Diagnosis Date   Allergy Penicillin   Cancer (HCC) 2010 and 2012 [facility]   Irregular menses     Past Surgical History:  Procedure Laterality Date   CERVICAL BIOPSY  W/ LOOP ELECTRODE EXCISION     CESAREAN SECTION     COLONOSCOPY WITH PROPOFOL  N/A 11/15/2022   Procedure: COLONOSCOPY WITH PROPOFOL ;  Surgeon: Unk Corinn Skiff, MD;  Location: [facility] ENDOSCOPY;  Service: Gastroenterology;  Laterality: N/A;   LEEP  2008   LEEP  2010    Current Outpatient Medications on File Prior to Visit  Medication Sig Dispense Refill   busPIRone  (BUSPAR ) 5 MG tablet TAKE 1 TABLET BY MOUTH EVERY DAY 90 tablet 1   candesartan  (ATACAND ) 4 MG tablet Take 1 tablet (4 mg total) by mouth daily. 90 tablet 0   No current facility-administered medications on file prior to visit.    Allergies   Allergen Reactions   Acetaminophen Hives   Penicillins Hives    Social History   Socioeconomic History   Marital status: Single    Spouse name: Not on file   Number of children: Not on file   Years of education: Not on file   Highest education level: Not on file  Occupational History   Not on file  Tobacco Use   Smoking status: Never   Smokeless tobacco: Never   Tobacco comments:    Never  smoke  Vaping Use   Vaping status: Never Used  Substance and Sexual Activity   Alcohol use: Yes    Alcohol/week: 2.0 standard drinks of alcohol    Types: 1 Glasses of wine, 1 Cans of beer per week   Drug use: Never   Sexual activity: Yes    Birth control/protection: None  Other Topics Concern   Not on file  Social History Narrative   Not on file   Social Drivers of Health   Financial Resource Strain: Not on file  Food Insecurity: Not on file  Transportation Needs: Not on file  Physical Activity: Not on file  Stress: Not on file  Social Connections: Not on file  Intimate Partner Violence: Not on file    Family History  Problem Relation Age of Onset   Breast cancer Mother 58   Hypertension Mother    Arthritis Mother    Cancer Mother    Diabetes Father    Hypertension Father    Breast cancer Sister 85   Breast cancer Maternal Aunt 69   Cancer Maternal Grandfather    Cancer Sister    Cancer Maternal Aunt    Ovarian cancer Neg Hx    Cancer - Colon Neg Hx     The following portions of the patient's history were reviewed and updated as appropriate: allergies, current medications, past family history, past medical history, past social history, past surgical history and problem list.  Review of Systems Review of Systems - Negative except as mentioned in HPI Review of Systems - General ROS: negative for - chills, fatigue, fever, hot flashes, malaise or night sweats Hematological and Lymphatic ROS: negative for - bleeding problems or swollen lymph nodes Gastrointestinal  ROS: negative for - abdominal pain, blood in stools, change in bowel habits and nausea/vomiting Musculoskeletal ROS: negative for - joint pain, muscle pain or muscular weakness Genito-Urinary ROS: negative for - change in menstrual cycle, dysmenorrhea, dyspareunia, dysuria, genital ulcers, hematuria, incontinence, irregular/heavy menses, nocturia or pelvic pain. Positive for  genital discharge, burning that has not resolved, dyspareunia   Objective:   BP 132/81   Pulse 81   Ht 5' 6 (1.676 m)   Wt 145 lb 14.4 oz (66.2 kg)   LMP  (LMP Unknown)   BMI 23.55 kg/m  CONSTITUTIONAL: Well-developed, well-nourished female in no acute distress.  HENT:  Normocephalic, atraumatic.  NECK: Normal range of motion, supple, no masses.  Normal thyroid.  SKIN: Skin is warm and dry. No rash noted. Not diaphoretic. No erythema. No pallor. NEUROLGIC: Alert and oriented to person, place, and time. PSYCHIATRIC: Normal mood and affect. Normal behavior. Normal judgment and thought content. CARDIOVASCULAR:Not Examined RESPIRATORY: Not Examined BREASTS: Not Examined ABDOMEN: Soft, non distended; Non tender.  No Organomegaly. PELVIC:  External Genitalia: Normal  BUS: Normal  Vagina: Normal, pale atrophic changes, no pain with spec exam  Cervix: Normal, clear discharge , no odor. Swab collected    MUSCULOSKELETAL: Normal range of motion. No tenderness.  No cyanosis, clubbing, or edema.     Assessment:   1. Vaginal discharge (Primary)  - NuSwab Vaginitis Plus (VG+)  2. History of prediabetes  - Hemoglobin A1c     Plan:   Discussed vaginal atrophy with perimenopause /menopause. Discussed treatment options. Encourage lubrication with intercourse . Would like to try vaginal estrogen . Orders placed.   Zelda Hummer, CNM

## 2024-06-05 ENCOUNTER — Encounter: Payer: Self-pay | Admitting: Certified Nurse Midwife

## 2024-06-05 LAB — HEMOGLOBIN A1C
Est. average glucose Bld gHb Est-mCnc: 108 mg/dL
Hgb A1c MFr Bld: 5.4 % (ref 4.8–5.6)

## 2024-06-07 ENCOUNTER — Other Ambulatory Visit: Payer: Self-pay

## 2024-06-07 LAB — NUSWAB VAGINITIS PLUS (VG+)
Candida albicans, NAA: NEGATIVE
Candida glabrata, NAA: NEGATIVE
Chlamydia trachomatis, NAA: NEGATIVE
Neisseria gonorrhoeae, NAA: NEGATIVE
Trich vag by NAA: POSITIVE — AB

## 2024-06-07 MED ORDER — METRONIDAZOLE 500 MG PO TABS: ORAL_TABLET | ORAL | 0 refills | Status: DC

## 2024-06-08 ENCOUNTER — Other Ambulatory Visit: Payer: Self-pay | Admitting: Certified Nurse Midwife

## 2024-06-08 ENCOUNTER — Encounter: Payer: Self-pay | Admitting: Certified Nurse Midwife

## 2024-06-08 DIAGNOSIS — Z139 Encounter for screening, unspecified: Secondary | ICD-10-CM

## 2024-06-08 NOTE — Telephone Encounter (Signed)
 duplicate

## 2024-06-09 ENCOUNTER — Other Ambulatory Visit: Payer: Self-pay

## 2024-06-09 DIAGNOSIS — A599 Trichomoniasis, unspecified: Secondary | ICD-10-CM

## 2024-06-09 MED ORDER — METRONIDAZOLE 500 MG PO TABS: 500.0000 mg | ORAL_TABLET | Freq: Two times a day (BID) | ORAL | 1 refills | Status: DC

## 2024-06-10 ENCOUNTER — Other Ambulatory Visit: Payer: Self-pay

## 2024-06-10 DIAGNOSIS — A599 Trichomoniasis, unspecified: Secondary | ICD-10-CM

## 2024-06-10 MED ORDER — METRONIDAZOLE 500 MG PO TABS: 500.0000 mg | ORAL_TABLET | Freq: Two times a day (BID) | ORAL | 1 refills | Status: DC

## 2024-06-12 ENCOUNTER — Other Ambulatory Visit: Payer: Self-pay | Admitting: Certified Nurse Midwife

## 2024-06-12 DIAGNOSIS — A599 Trichomoniasis, unspecified: Secondary | ICD-10-CM

## 2024-06-14 NOTE — Telephone Encounter (Signed)
 We have availability this week and next week on the nurse schedule.  I called pt and left a message.

## 2024-06-18 ENCOUNTER — Ambulatory Visit (INDEPENDENT_AMBULATORY_CARE_PROVIDER_SITE_OTHER)

## 2024-06-18 VITALS — BP 122/87 | HR 82 | Ht 66.0 in | Wt 148.0 lb

## 2024-06-18 DIAGNOSIS — Z113 Encounter for screening for infections with a predominantly sexual mode of transmission: Secondary | ICD-10-CM

## 2024-06-18 DIAGNOSIS — N949 Unspecified condition associated with female genital organs and menstrual cycle: Secondary | ICD-10-CM

## 2024-06-18 LAB — POCT URINALYSIS DIPSTICK
Bilirubin, UA: NEGATIVE
Glucose, UA: NEGATIVE
Ketones, UA: NEGATIVE
Leukocytes, UA: NEGATIVE
Nitrite, UA: NEGATIVE
Protein, UA: NEGATIVE
Spec Grav, UA: 1.01 (ref 1.010–1.025)
Urobilinogen, UA: 0.2 U/dL
pH, UA: 6 (ref 5.0–8.0)

## 2024-06-18 NOTE — Progress Notes (Signed)
    NURSE VISIT NOTE  Subjective:    Patient ID: Monica Gallegos, female    DOB: 09-24-1976, 48 y.o.   MRN: [id number]  HPI  Patient reports she completed the prescribed trichomoniasis medication, although she and her partner both took the medication. She admitted to skipping the last two pills because she wanted to ensure she was not misdiagnosed. I confirmed the patient's label and specimen with her, and she observed me labeling the swab. No other concerns were noted during the visit. Her urine was clear during the office visit.I reassured the patient that I would send the sample to [facility] for additional testing. The patient confirmed and verified the label for all specimen collected today. Objective:    BP 122/87   Pulse 82   Ht 5' 6 (1.676 m)   Wt 148 lb (67.1 kg)   LMP  (LMP Unknown)   BMI 23.89 kg/m     Assessment:   1. Screening examination for STD (sexually transmitted disease)   2. Vaginal discomfort       Plan:   GC and chlamydia DNA  probe sent to lab. ROV prn if symptoms persist or worsen.   Jaquanna Ballentine H Makylah Bossard, CMA

## 2024-06-22 ENCOUNTER — Encounter: Payer: Self-pay | Admitting: Certified Nurse Midwife

## 2024-06-22 LAB — NUSWAB VAGINITIS PLUS (VG+)
Candida albicans, NAA: NEGATIVE
Candida glabrata, NAA: NEGATIVE
Chlamydia trachomatis, NAA: NEGATIVE
Neisseria gonorrhoeae, NAA: NEGATIVE
Trich vag by NAA: NEGATIVE

## 2024-06-23 LAB — URINE CULTURE

## 2024-06-24 ENCOUNTER — Ambulatory Visit: Payer: Self-pay | Admitting: Certified Nurse Midwife

## 2024-07-08 ENCOUNTER — Ambulatory Visit (INDEPENDENT_AMBULATORY_CARE_PROVIDER_SITE_OTHER)

## 2024-07-08 VITALS — BP 104/70 | HR 84 | Ht 66.0 in | Wt 148.0 lb

## 2024-07-08 DIAGNOSIS — Z113 Encounter for screening for infections with a predominantly sexual mode of transmission: Secondary | ICD-10-CM | POA: Diagnosis not present

## 2024-07-08 NOTE — Patient Instructions (Signed)
 Sexually Transmitted Infection Caused by a Parasite (Trichomoniasis): What to Know Trichomoniasis is a sexually transmitted infection (STI). Many people with this infection don't have any symptoms. This means you may not know that you have it, but you can still spread the infection to other people. If it's not treated, trichomoniasis can last for months or years. This condition is treated with medicine. What are the causes? This disease is caused by a parasite called Trichomonas vaginalis. It's spread through sex. What increases the risk? You're likely to get this infection if: You have unprotected sex. You have sex with someone who has the infection. You have sex with more than one person. You have had trichomoniasis or other STIs in the past. What are the signs or symptoms? In females, symptoms include: Itching, burning, redness, or soreness in the genital area. Discomfort while peeing. Vaginal discharge that's clear, white, gray, or yellow-green. The discharge is foamy and has a fishy odor. In males, symptoms include: Discharge from the penis. Burning after peeing or ejaculation. Itching or discomfort inside the penis. How is this diagnosed? This infection is diagnosed based on tests. Your health care provider will: Do a pee test. Test a small amount of the discharge. In females, the discharge is taken from the vagina or the cervix. In males, the discharge is taken from the urethra. The urethra is the part of the body that drains urine from the bladder. Your provider may test for other STIs, including the human immunodeficiency virus (HIV). How is this treated? You'll be treated with: Antibiotics to kill the parasite, such as metronidazole  or tinidazole. You'll take these medicines by mouth. Over-the-counter medicines or creams. These help to relieve itching or irritation. If you plan to become pregnant or think you may be pregnant, tell your provider right away. Some medicines that  are used to treat the infection should not be taken during pregnancy. Follow these instructions at home: Medicines Take your medicines only as told. Take your antibiotics as told. Do not stop taking them even if you start to feel better. Use creams as told by your provider. General instructions Do not have sex until you finish your medicine and your symptoms have gone away. Do not have sex if you have symptoms of trichomoniasis or another STI. Talk with your sex partner or partners about any symptoms that either of you may have, as well as any history of STIs. Your sex partner or partners need to be tested and treated. If they have the infection and they're not treated, you can get infected again. For females: Do not douche. Douching may increase your risk for STIs because it removes good bacteria from the vagina. Do not wear tampons while you have the infection. Keep all follow-up visits. You may be tested for the infection again 3 months after treatment. Contact a health care provider if: You still have symptoms after you finish your medicine. You get a rash. You plan to become pregnant or think you may be pregnant. This information is not intended to replace advice given to you by your health care provider. Make sure you discuss any questions you have with your health care provider. Document Revised: 01/08/2024 Document Reviewed: 01/08/2024 Elsevier Patient Education  2025 [employer].

## 2024-07-08 NOTE — Progress Notes (Signed)
    NURSE VISIT NOTE  Subjective:    Patient ID: Monica Gallegos, female    DOB: [birth year], 48 y.o.   MRN: [id number]  HPI  Patient is a 48 y.o. G47P1001 female who presents for test of cure. Denies abnormal vaginal bleeding or significant pelvic pain or fever. Denies UTI symptoms. Patient has history of known exposure to STD.  Objective:    BP 104/70   Pulse 84   Ht 5' 6 (1.676 m)   Wt 148 lb (67.1 kg)   LMP  (LMP Unknown)   BMI 23.89 kg/m     Assessment:   1. Screening for STD (sexually transmitted disease)      Plan:   Aptima sent to lab. Treatment: Will wait for results to be treated if needed. ROV prn if symptoms persist or worsen.   Beola Skeens, CMA

## 2024-07-11 LAB — NUSWAB VAGINITIS PLUS (VG+)
Candida albicans, NAA: NEGATIVE
Candida glabrata, NAA: NEGATIVE
Chlamydia trachomatis, NAA: NEGATIVE
Neisseria gonorrhoeae, NAA: NEGATIVE
Trich vag by NAA: NEGATIVE

## 2024-07-13 ENCOUNTER — Encounter: Payer: Self-pay | Admitting: Certified Nurse Midwife

## 2024-09-21 ENCOUNTER — Encounter: Payer: Self-pay | Admitting: Physician Assistant

## 2024-09-21 ENCOUNTER — Telehealth: Payer: Self-pay | Admitting: Physician Assistant

## 2024-09-21 ENCOUNTER — Ambulatory Visit: Admitting: Physician Assistant

## 2024-09-21 VITALS — BP 117/92 | HR 103 | Resp 16 | Ht 66.0 in | Wt 144.6 lb

## 2024-09-21 DIAGNOSIS — I10 Essential (primary) hypertension: Secondary | ICD-10-CM

## 2024-09-21 DIAGNOSIS — K582 Mixed irritable bowel syndrome: Secondary | ICD-10-CM

## 2024-09-21 DIAGNOSIS — F419 Anxiety disorder, unspecified: Secondary | ICD-10-CM | POA: Diagnosis not present

## 2024-09-21 DIAGNOSIS — R140 Abdominal distension (gaseous): Secondary | ICD-10-CM | POA: Diagnosis not present

## 2024-09-21 NOTE — Progress Notes (Signed)
 " Established patient visit  Patient: Monica Gallegos   DOB: 1976-04-27   48 y.o. Female  MRN: [id number] Visit Date: 09/21/2024  Today's healthcare provider: Jolynn Spencer, PA-C   Chief Complaint  Patient presents with   Medical Management of Chronic Issues    Chronic IBS-patient would like a referral to GI-patient was followed by GI specialist in [place]  several years ago. Patient symptoms are: diarrhea and constipation, abdominal pain. Patient also needs to file for intermittent FMLA.   Hypertension    Patient stopped Candesartan  2 months ago. Reports BP readings re normal.  Bp's reading at home averaging in the 110/70's   Subjective     HPI     Medical Management of Chronic Issues    Additional comments: Chronic IBS-patient would like a referral to GI-patient was followed by GI specialist in [place]  several years ago. Patient symptoms are: diarrhea and constipation, abdominal pain. Patient also needs to file for intermittent FMLA.        Hypertension    Additional comments: Patient stopped Candesartan  2 months ago. Reports BP readings re normal.  Bp's reading at home averaging in the 110/70's      Last edited by Rosas, Joseline E, CMA on 09/21/2024  8:03 AM.       Discussed the use of AI scribe software for clinical note transcription with the patient, who gave verbal consent to proceed.  History of Present Illness Monica Gallegos is a 48 year old female with irritable bowel syndrome who presents with a flare-up of symptoms.  She has had IBS since her twenties with alternating constipation and diarrhea. Current flare began Wednesday night, peaked on Friday December 19 and was severe enough to cause her to miss work. She has severe abdominal pain, bloating, gas, and unpredictable urgent bowel movements that shift from constipation to diarrhea.  She takes miopropan (antispasmodic from [place]) for over twenty years and daily simethicone, but symptoms still  significantly affect her work, which requires prolonged sitting and concentration. She follows a low FODMAP diet.  She has not yet seen a gastroenterologist in [place]  and is seeking to establish care with a new specialist while continuing management started by prior providers.  She also has anxiety treated with Buspar . She notes stress worsens her IBS. Pilates and similar activities help with stress.  She reports ongoing bloating, pain, and gas even after bowel movements. She denies recent dietary changes that could explain the flare.       09/21/2024    8:04 AM 04/22/2023    4:18 PM 10/18/2022    3:41 PM  Depression screen PHQ 2/9  Decreased Interest 0 0 0  Down, Depressed, Hopeless 0 0 0  PHQ - 2 Score 0 0 0  Altered sleeping  3 1  Tired, decreased energy  2 2  Change in appetite  0 0  Feeling bad or failure about yourself   0 0  Trouble concentrating  0 0  Moving slowly or fidgety/restless  0 0  Suicidal thoughts  0 0  PHQ-9 Score  5  3   Difficult doing work/chores  Not difficult at all      Data saved with a previous flowsheet row definition      04/22/2023    4:39 PM  GAD 7 : Generalized Anxiety Score  Nervous, Anxious, on Edge 2  Control/stop worrying 2  Worry too much - different things 2  Trouble relaxing 2  Restless 2  Easily annoyed or irritable  1  Afraid - awful might happen 1  Total GAD 7 Score 12    Medications: Show/hide medication list[1]  Review of Systems All negative Except see HPI       Objective    BP (!) 117/92 (BP Location: Left Arm, Patient Position: Sitting, Cuff Size: Normal)   Pulse (!) 103   Resp 16   Ht 5' 6 (1.676 m)   Wt 144 lb 9.6 oz (65.6 kg)   LMP  (LMP Unknown)   SpO2 99%   BMI 23.34 kg/m     Physical Exam Vitals reviewed.  Constitutional:      General: She is not in acute distress.    Appearance: Normal appearance. She is well-developed. She is not diaphoretic.  HENT:     Head: Normocephalic and  atraumatic.  Eyes:     General: No scleral icterus.    Conjunctiva/sclera: Conjunctivae normal.  Neck:     Thyroid: No thyromegaly.  Cardiovascular:     Rate and Rhythm: Normal rate and regular rhythm.     Pulses: Normal pulses.     Heart sounds: Normal heart sounds. No murmur heard. Pulmonary:     Effort: Pulmonary effort is normal. No respiratory distress.     Breath sounds: Normal breath sounds. No wheezing, rhonchi or rales.  Abdominal:     General: Bowel sounds are normal. There is distension.     Palpations: There is no mass.     Tenderness: There is no abdominal tenderness. There is no right CVA tenderness, left CVA tenderness, guarding or rebound.     Hernia: No hernia is present.  Musculoskeletal:     Cervical back: Neck supple.     Right lower leg: No edema.     Left lower leg: No edema.  Lymphadenopathy:     Cervical: No cervical adenopathy.  Skin:    General: Skin is warm and dry.     Findings: No rash.  Neurological:     Mental Status: She is alert and oriented to person, place, and time. Mental status is at baseline.  Psychiatric:        Mood and Affect: Mood normal.        Behavior: Behavior normal.      No results found for any visits on 09/21/24.      Assessment & Plan Irritable bowel syndrome with mixed bowel habits Chronic IBS with alternating constipation and diarrhea, recent flare-up with bloating and urgent diarrhea. Managed with myopropan, symptoms persist despite dietary management. - Continue myopropan. - Advised dietary modifications to avoid trigger foods. - Referred to gastroenterologist for further evaluation. - Completed documentation for job protection. - Scheduled follow-up in 3-4 weeks. Will follow-up  Anxiety disorder Anxiety disorder managed with Buspar  5mg , reports improvement with current treatment and lifestyle modifications.  Has association between brain-gut syndrome - Continue Buspar . - Encouraged continuation of lifestyle  modifications, including Pilates and stress management. Will follow-up  Intermittent FMLA paper work will be submitted on behalf of patient  Abdominal bloating (Primary)  - Ambulatory referral to Gastroenterology  Irritable bowel syndrome with both constipation and diarrhea  - Ambulatory referral to Gastroenterology  Primary hypertension Chronic and unstable, could be due to pain Continue taking candesartan  4 mg daily Continue lifestyle modifications Will follow-up  Orders Placed This Encounter  Procedures   Ambulatory referral to Gastroenterology    Referral Priority:   Routine    Referral Type:   Consultation    Referral Reason:   Specialty Services  Required    Number of Visits Requested:   1    No follow-ups on file.   The patient was advised to call back or seek an in-person evaluation if the symptoms worsen or if the condition fails to improve as anticipated.  I discussed the assessment and treatment plan with the patient. The patient was provided an opportunity to ask questions and all were answered. The patient agreed with the plan and demonstrated an understanding of the instructions.  I, Jouri Threat, PA-C have reviewed all documentation for this visit. The documentation on 09/21/2024  for the exam, diagnosis, procedures, and orders are all accurate and complete.  Jolynn Spencer, [facility], MMS [facility] [phone] (phone) [phone] (fax)  [facility]     [1]  Outpatient Medications Prior to Visit  Medication Sig   busPIRone  (BUSPAR ) 5 MG tablet TAKE 1 TABLET BY MOUTH EVERY DAY   estradiol  (ESTRACE  VAGINAL) 0.1 MG/GM vaginal cream Place 1 Applicatorful vaginally at bedtime. Daily for 2 weeks then ,one to three times weekly .   candesartan  (ATACAND ) 4 MG tablet Take 1 tablet (4 mg total) by mouth daily. (Patient not taking: Reported on 09/21/2024)   No facility-administered medications prior to visit.   "

## 2024-09-21 NOTE — Telephone Encounter (Signed)
 Copied from CRM #[id number]. Topic: General - Other >> Sep 21, 2024 11:07 AM Jeoffrey H wrote: Reason for CRM: Patient stated she needs her FMLA paperwork by 10/01/2024 and not 10/05/2024. Can fax it to number on the paper. [phone]. And her ID number is [id number]  Sheli- [phone]

## 2024-09-24 NOTE — Telephone Encounter (Signed)
 Faxed FMLA paper work today at number that was provided. Made a file copy.

## 2024-09-28 ENCOUNTER — Telehealth: Payer: Self-pay | Admitting: Physician Assistant

## 2024-09-28 NOTE — Telephone Encounter (Addendum)
 Patient come into office to drop off FMLA paperwork, patient originally picked up forms but employer is needing more clarification on FMLA paperwork. I advised that Dr. Dineen is out of office until 10/11/24, patient states that this form is due 10/06/24. Placed in [place] box to be worked if possible since PCP is out of office.   Patient wants us  to fax the form to the employer, wants a call when complete [phone]. Patient states that she does not need a copy of the forms.

## 2024-10-06 NOTE — Telephone Encounter (Signed)
 Copied from CRM [id number]. Topic: General - Other >> Oct 06, 2024  1:59 PM Tiffini S wrote: Reason for CRM: Patient called stating that there is an extension for the due date to 10/14/23 for FMLA paperwork to be competed- please make the corrections to the due date    Please call to follow up with the patient at [phone]

## 2024-11-08 ENCOUNTER — Ambulatory Visit: Admitting: Physician Assistant

## 2024-11-17 ENCOUNTER — Ambulatory Visit: Admitting: Family Medicine

## 2024-12-02 ENCOUNTER — Ambulatory Visit: Admitting: Gastroenterology

## 2025-01-13 ENCOUNTER — Ambulatory Visit: Admitting: Certified Nurse Midwife
